# Patient Record
Sex: Female | Born: 1970 | Race: White | Hispanic: No | Marital: Married | Smoking: Never smoker
Health system: Southern US, Community
[De-identification: ages and names within clinical notes are randomized; demographics above are authoritative.]

## PROBLEM LIST (undated history)

## (undated) DIAGNOSIS — S0291XA Unspecified fracture of skull, initial encounter for closed fracture: Secondary | ICD-10-CM

## (undated) HISTORY — PX: HERNIA REPAIR: SHX51

---

## 2013-02-16 DIAGNOSIS — Z720 Tobacco use: Secondary | ICD-10-CM | POA: Insufficient documentation

## 2013-02-16 DIAGNOSIS — E039 Hypothyroidism, unspecified: Secondary | ICD-10-CM | POA: Insufficient documentation

## 2013-02-16 DIAGNOSIS — Z975 Presence of (intrauterine) contraceptive device: Secondary | ICD-10-CM | POA: Insufficient documentation

## 2014-01-13 DIAGNOSIS — Z8719 Personal history of other diseases of the digestive system: Secondary | ICD-10-CM | POA: Insufficient documentation

## 2014-01-13 DIAGNOSIS — Z9889 Other specified postprocedural states: Secondary | ICD-10-CM | POA: Insufficient documentation

## 2015-07-27 DIAGNOSIS — M6790 Unspecified disorder of synovium and tendon, unspecified site: Secondary | ICD-10-CM | POA: Insufficient documentation

## 2015-07-27 DIAGNOSIS — N6001 Solitary cyst of right breast: Secondary | ICD-10-CM | POA: Insufficient documentation

## 2016-03-19 DIAGNOSIS — M47812 Spondylosis without myelopathy or radiculopathy, cervical region: Secondary | ICD-10-CM | POA: Insufficient documentation

## 2016-05-22 DIAGNOSIS — F331 Major depressive disorder, recurrent, moderate: Secondary | ICD-10-CM | POA: Insufficient documentation

## 2016-12-13 DIAGNOSIS — F330 Major depressive disorder, recurrent, mild: Secondary | ICD-10-CM | POA: Diagnosis not present

## 2016-12-13 DIAGNOSIS — F411 Generalized anxiety disorder: Secondary | ICD-10-CM | POA: Diagnosis not present

## 2016-12-17 DIAGNOSIS — F411 Generalized anxiety disorder: Secondary | ICD-10-CM | POA: Diagnosis not present

## 2017-01-21 DIAGNOSIS — M47812 Spondylosis without myelopathy or radiculopathy, cervical region: Secondary | ICD-10-CM | POA: Diagnosis not present

## 2017-01-21 DIAGNOSIS — E039 Hypothyroidism, unspecified: Secondary | ICD-10-CM | POA: Diagnosis not present

## 2017-01-21 DIAGNOSIS — F329 Major depressive disorder, single episode, unspecified: Secondary | ICD-10-CM | POA: Diagnosis not present

## 2017-01-21 DIAGNOSIS — F411 Generalized anxiety disorder: Secondary | ICD-10-CM | POA: Diagnosis not present

## 2017-01-30 DIAGNOSIS — F330 Major depressive disorder, recurrent, mild: Secondary | ICD-10-CM | POA: Diagnosis not present

## 2017-02-18 DIAGNOSIS — F411 Generalized anxiety disorder: Secondary | ICD-10-CM | POA: Diagnosis not present

## 2017-03-11 DIAGNOSIS — F411 Generalized anxiety disorder: Secondary | ICD-10-CM | POA: Diagnosis not present

## 2017-03-13 DIAGNOSIS — F411 Generalized anxiety disorder: Secondary | ICD-10-CM | POA: Diagnosis not present

## 2017-03-13 DIAGNOSIS — F330 Major depressive disorder, recurrent, mild: Secondary | ICD-10-CM | POA: Diagnosis not present

## 2017-04-08 DIAGNOSIS — F411 Generalized anxiety disorder: Secondary | ICD-10-CM | POA: Diagnosis not present

## 2017-04-10 DIAGNOSIS — F411 Generalized anxiety disorder: Secondary | ICD-10-CM | POA: Diagnosis not present

## 2017-04-10 DIAGNOSIS — F331 Major depressive disorder, recurrent, moderate: Secondary | ICD-10-CM | POA: Diagnosis not present

## 2017-05-05 DIAGNOSIS — H40033 Anatomical narrow angle, bilateral: Secondary | ICD-10-CM | POA: Diagnosis not present

## 2017-05-05 DIAGNOSIS — H04123 Dry eye syndrome of bilateral lacrimal glands: Secondary | ICD-10-CM | POA: Diagnosis not present

## 2017-05-06 DIAGNOSIS — F332 Major depressive disorder, recurrent severe without psychotic features: Secondary | ICD-10-CM | POA: Diagnosis not present

## 2017-05-06 DIAGNOSIS — F411 Generalized anxiety disorder: Secondary | ICD-10-CM | POA: Diagnosis not present

## 2017-05-07 DIAGNOSIS — H1013 Acute atopic conjunctivitis, bilateral: Secondary | ICD-10-CM | POA: Diagnosis not present

## 2017-06-10 DIAGNOSIS — F411 Generalized anxiety disorder: Secondary | ICD-10-CM | POA: Diagnosis not present

## 2017-07-08 DIAGNOSIS — F332 Major depressive disorder, recurrent severe without psychotic features: Secondary | ICD-10-CM | POA: Diagnosis not present

## 2017-07-08 DIAGNOSIS — F411 Generalized anxiety disorder: Secondary | ICD-10-CM | POA: Diagnosis not present

## 2017-08-03 DIAGNOSIS — H04123 Dry eye syndrome of bilateral lacrimal glands: Secondary | ICD-10-CM | POA: Diagnosis not present

## 2017-08-05 DIAGNOSIS — F411 Generalized anxiety disorder: Secondary | ICD-10-CM | POA: Diagnosis not present

## 2017-08-09 DIAGNOSIS — M47812 Spondylosis without myelopathy or radiculopathy, cervical region: Secondary | ICD-10-CM | POA: Diagnosis not present

## 2017-08-09 DIAGNOSIS — M25511 Pain in right shoulder: Secondary | ICD-10-CM | POA: Diagnosis not present

## 2017-08-09 DIAGNOSIS — E039 Hypothyroidism, unspecified: Secondary | ICD-10-CM | POA: Diagnosis not present

## 2017-08-09 DIAGNOSIS — Z791 Long term (current) use of non-steroidal anti-inflammatories (NSAID): Secondary | ICD-10-CM | POA: Diagnosis not present

## 2017-09-17 DIAGNOSIS — R41840 Attention and concentration deficit: Secondary | ICD-10-CM | POA: Diagnosis not present

## 2017-09-17 DIAGNOSIS — F902 Attention-deficit hyperactivity disorder, combined type: Secondary | ICD-10-CM | POA: Diagnosis not present

## 2017-09-17 DIAGNOSIS — Z79899 Other long term (current) drug therapy: Secondary | ICD-10-CM | POA: Diagnosis not present

## 2017-09-17 DIAGNOSIS — F419 Anxiety disorder, unspecified: Secondary | ICD-10-CM | POA: Diagnosis not present

## 2017-10-07 DIAGNOSIS — H04123 Dry eye syndrome of bilateral lacrimal glands: Secondary | ICD-10-CM | POA: Diagnosis not present

## 2017-10-22 DIAGNOSIS — R41840 Attention and concentration deficit: Secondary | ICD-10-CM | POA: Diagnosis not present

## 2017-10-22 DIAGNOSIS — Z79899 Other long term (current) drug therapy: Secondary | ICD-10-CM | POA: Diagnosis not present

## 2017-10-22 DIAGNOSIS — F338 Other recurrent depressive disorders: Secondary | ICD-10-CM | POA: Diagnosis not present

## 2017-10-22 DIAGNOSIS — F902 Attention-deficit hyperactivity disorder, combined type: Secondary | ICD-10-CM | POA: Diagnosis not present

## 2017-10-22 DIAGNOSIS — F419 Anxiety disorder, unspecified: Secondary | ICD-10-CM | POA: Diagnosis not present

## 2017-11-07 DIAGNOSIS — F411 Generalized anxiety disorder: Secondary | ICD-10-CM | POA: Diagnosis not present

## 2017-11-12 DIAGNOSIS — F411 Generalized anxiety disorder: Secondary | ICD-10-CM | POA: Diagnosis not present

## 2017-11-12 DIAGNOSIS — F332 Major depressive disorder, recurrent severe without psychotic features: Secondary | ICD-10-CM | POA: Diagnosis not present

## 2017-11-12 DIAGNOSIS — F900 Attention-deficit hyperactivity disorder, predominantly inattentive type: Secondary | ICD-10-CM | POA: Diagnosis not present

## 2017-12-09 DIAGNOSIS — F332 Major depressive disorder, recurrent severe without psychotic features: Secondary | ICD-10-CM | POA: Diagnosis not present

## 2017-12-09 DIAGNOSIS — F411 Generalized anxiety disorder: Secondary | ICD-10-CM | POA: Diagnosis not present

## 2017-12-09 DIAGNOSIS — F900 Attention-deficit hyperactivity disorder, predominantly inattentive type: Secondary | ICD-10-CM | POA: Diagnosis not present

## 2017-12-11 DIAGNOSIS — H04123 Dry eye syndrome of bilateral lacrimal glands: Secondary | ICD-10-CM | POA: Diagnosis not present

## 2017-12-13 DIAGNOSIS — F411 Generalized anxiety disorder: Secondary | ICD-10-CM | POA: Diagnosis not present

## 2017-12-23 DIAGNOSIS — H04123 Dry eye syndrome of bilateral lacrimal glands: Secondary | ICD-10-CM | POA: Diagnosis not present

## 2017-12-30 ENCOUNTER — Encounter (HOSPITAL_COMMUNITY): Payer: Self-pay | Admitting: *Deleted

## 2017-12-30 ENCOUNTER — Emergency Department (HOSPITAL_COMMUNITY)
Admission: EM | Admit: 2017-12-30 | Discharge: 2017-12-30 | Disposition: A | Discharge status: Home / Self Care | Payer: Worker's Compensation | Attending: Emergency Medicine | Admitting: Emergency Medicine

## 2017-12-30 ENCOUNTER — Emergency Department (HOSPITAL_COMMUNITY): Payer: Worker's Compensation

## 2017-12-30 DIAGNOSIS — S39011A Strain of muscle, fascia and tendon of abdomen, initial encounter: Secondary | ICD-10-CM | POA: Insufficient documentation

## 2017-12-30 DIAGNOSIS — R109 Unspecified abdominal pain: Secondary | ICD-10-CM | POA: Diagnosis not present

## 2017-12-30 DIAGNOSIS — X58XXXA Exposure to other specified factors, initial encounter: Secondary | ICD-10-CM | POA: Diagnosis not present

## 2017-12-30 DIAGNOSIS — Y990 Civilian activity done for income or pay: Secondary | ICD-10-CM | POA: Diagnosis not present

## 2017-12-30 DIAGNOSIS — W1830XA Fall on same level, unspecified, initial encounter: Secondary | ICD-10-CM | POA: Diagnosis not present

## 2017-12-30 DIAGNOSIS — Y939 Activity, unspecified: Secondary | ICD-10-CM | POA: Insufficient documentation

## 2017-12-30 DIAGNOSIS — Y929 Unspecified place or not applicable: Secondary | ICD-10-CM | POA: Insufficient documentation

## 2017-12-30 DIAGNOSIS — T148XXA Other injury of unspecified body region, initial encounter: Secondary | ICD-10-CM

## 2017-12-30 LAB — COMPREHENSIVE METABOLIC PANEL
ALT: 16 U/L (ref 0–44)
AST: 22 U/L (ref 15–41)
Albumin: 4 g/dL (ref 3.5–5.0)
Alkaline Phosphatase: 50 U/L (ref 38–126)
Anion gap: 8 (ref 5–15)
BUN: 14 mg/dL (ref 6–20)
CO2: 27 mmol/L (ref 22–32)
CREATININE: 0.88 mg/dL (ref 0.44–1.00)
Calcium: 9.1 mg/dL (ref 8.9–10.3)
Chloride: 102 mmol/L (ref 98–111)
GFR calc Af Amer: >60 mL/min (ref >60)
GFR calc non Af Amer: >60 mL/min (ref >60)
Glucose, Bld: 89 mg/dL (ref 70–99)
Potassium: 4.2 mmol/L (ref 3.5–5.1)
Sodium: 137 mmol/L (ref 135–145)
Total Bilirubin: 0.7 mg/dL (ref 0.3–1.2)
Total Protein: 7.2 g/dL (ref 6.5–8.1)

## 2017-12-30 LAB — CBC WITH DIFFERENTIAL/PLATELET
Abs Immature Granulocytes: 0.01 10*3/uL (ref 0.00–0.07)
Basophils Absolute: 0.1 10*3/uL (ref 0.0–0.1)
Basophils Relative: 1 %
Eosinophils Absolute: 0.2 10*3/uL (ref 0.0–0.5)
Eosinophils Relative: 3 %
HCT: 41.3 % (ref 36.0–46.0)
Hemoglobin: 13.3 g/dL (ref 12.0–15.0)
Immature Granulocytes: 0 %
Lymphocytes Relative: 30 %
Lymphs Abs: 1.8 10*3/uL (ref 0.7–4.0)
MCH: 30.6 pg (ref 26.0–34.0)
MCHC: 32.2 g/dL (ref 30.0–36.0)
MCV: 94.9 fL (ref 80.0–100.0)
MONOS PCT: 11 %
Monocytes Absolute: 0.6 10*3/uL (ref 0.1–1.0)
Neutro Abs: 3.3 10*3/uL (ref 1.7–7.7)
Neutrophils Relative %: 55 %
Platelets: 275 10*3/uL (ref 150–400)
RBC: 4.35 MIL/uL (ref 3.87–5.11)
RDW: 12.2 % (ref 11.5–15.5)
WBC: 5.9 10*3/uL (ref 4.0–10.5)
nRBC: 0 % (ref 0.0–0.2)

## 2017-12-30 LAB — I-STAT CHEM 8, ED
BUN: 13 mg/dL (ref 6–20)
Calcium, Ion: 1.13 mmol/L — ABNORMAL LOW (ref 1.15–1.40)
Chloride: 101 mmol/L (ref 98–111)
Creatinine, Ser: 0.9 mg/dL (ref 0.44–1.00)
Glucose, Bld: 87 mg/dL (ref 70–99)
HCT: 42 % (ref 36.0–46.0)
Hemoglobin: 14.3 g/dL (ref 12.0–15.0)
Potassium: 4.3 mmol/L (ref 3.5–5.1)
Sodium: 138 mmol/L (ref 135–145)
TCO2: 30 mmol/L (ref 22–32)

## 2017-12-30 LAB — LIPASE, BLOOD: Lipase: 31 U/L (ref 11–51)

## 2017-12-30 LAB — I-STAT BETA HCG BLOOD, ED (MC, WL, AP ONLY): I-stat hCG, quantitative: <5 m[IU]/mL (ref <5)

## 2017-12-30 MED ORDER — SODIUM CHLORIDE (PF) 0.9 % IJ SOLN
INTRAMUSCULAR | Status: AC
  Filled 2017-12-30: qty 50

## 2017-12-30 MED ORDER — IOPAMIDOL (ISOVUE-300) INJECTION 61%
100.0000 mL | Freq: Once | INTRAVENOUS | Status: AC | PRN
  Administered 2017-12-30: 100 mL via INTRAVENOUS

## 2017-12-30 MED ORDER — IOPAMIDOL (ISOVUE-300) INJECTION 61%
INTRAVENOUS | Status: AC
  Filled 2017-12-30: qty 100

## 2017-12-30 NOTE — ED Provider Notes (Signed)
[facility]-EMERGENCY DEPT Provider Note   CSN: [id number] Arrival date & time: 12/30/17  0904     History   Chief Complaint Chief Complaint  Patient presents with  . Fall  . Abdominal Pain    HPI Sandra Lyons is a 47 y.o. female presenting for evaluation of abdominal pain.  Patient states 4 days ago she was blowing leaves when she started to fall, and twisted to prevent falling.  This happened multiple times.  Since then, she has had periumbilical abdominal pain.  Pain is worse with movement.  This is where she had her hernia repair with mesh placement, and she is concerned that there are complications from this.  Surgery was 4 years ago, she has not had no complications since.  She has not taken anything for pain including Tylenol or ibuprofen.  She denies pain elsewhere.  She has nausea, vomiting, abnormal bowel movements.  Denies fevers, chills, chest pain, shortness of breath, or urinary symptoms.  No change in pain with p.o., urination, or bowel movements.  Patient states she has a history of ADHD, anxiety/depression, and hypothyroidism.  No recent change in her medications.  HPI  History reviewed. No pertinent past medical history.  There are no active problems to display for this patient.   Past Surgical History:  Procedure Laterality Date  . HERNIA REPAIR       OB History   No obstetric history on file.      Home Medications    Prior to Admission medications   Medication Sig Start Date End Date Taking? Authorizing Provider  amphetamine-dextroamphetamine (ADDERALL) 30 MG tablet Take 30 mg by mouth 2 (two) times daily.   Yes [provider]  buPROPion (WELLBUTRIN SR) 150 MG 12 hr tablet Take 450 mg by mouth 2 (two) times daily.   Yes [provider]  DULoxetine (CYMBALTA) 60 MG capsule Take 120 mg by mouth daily.   Yes [provider]  levothyroxine (SYNTHROID, LEVOTHROID) 125 MCG tablet Take 125 mcg by mouth  daily. 10/02/16  Yes [provider]    Family History No family history on file.  Social History Social History   Tobacco Use  . Smoking status: Never Smoker  . Smokeless tobacco: Never Used  Substance Use Topics  . Alcohol use: Yes  . Drug use: Not on file     Allergies   Sulfa antibiotics   Review of Systems Review of Systems  Gastrointestinal: Positive for abdominal pain.  All other systems reviewed and are negative.    Physical Exam Updated Vital Signs BP (!) 144/106 (BP Location: Left Arm)   Pulse 93   Temp 98.7 F (37.1 C) (Oral)   Resp 16   LMP  (LMP Unknown)   SpO2 98%   Physical Exam Vitals signs and nursing note reviewed.  Constitutional:      General: She is not in acute distress.    Appearance: She is well-developed.     Comments: Appears nontoxic  HENT:     Head: Normocephalic and atraumatic.  Eyes:     Conjunctiva/sclera: Conjunctivae normal.     Pupils: Pupils are equal, round, and reactive to light.  Neck:     Musculoskeletal: Normal range of motion and neck supple.  Cardiovascular:     Rate and Rhythm: Normal rate and regular rhythm.     Pulses: Normal pulses.  Pulmonary:     Effort: Pulmonary effort is normal. No respiratory distress.     Breath sounds:  Normal breath sounds. No wheezing.  Abdominal:     General: Bowel sounds are normal. There is no distension.     Palpations: Abdomen is soft.     Tenderness: There is abdominal tenderness in the periumbilical area.     Hernia: No hernia is present.     Comments: Tenderness to palpation of periumbilical abdomen with voluntary guarding.  No rigidity or distention.  Negative rebound.  No obvious masses.  No obvious hernia.  Musculoskeletal: Normal range of motion.  Skin:    General: Skin is warm and dry.  Neurological:     Mental Status: She is alert and oriented to person, place, and time.      ED Treatments / Results  Labs (all labs ordered are listed, but only  abnormal results are displayed) Labs Reviewed  I-STAT CHEM 8, ED - Abnormal; Notable for the following components:      Result Value   Calcium, Ion 1.13 (*)    All other components within normal limits  CBC WITH DIFFERENTIAL/PLATELET  COMPREHENSIVE METABOLIC PANEL  LIPASE, BLOOD  I-STAT BETA HCG BLOOD, ED ([facility], [facility], [facility] ONLY)    EKG None  Radiology Ct Abdomen Pelvis W Contrast  Result Date: 12/30/2017 CLINICAL DATA:  Periumbilical region abdominal pain.  Recent fall EXAM: CT ABDOMEN AND PELVIS WITH CONTRAST TECHNIQUE: Multidetector CT imaging of the abdomen and pelvis was performed using the standard protocol following bolus administration of intravenous contrast. CONTRAST:  100mL ISOVUE-300 IOPAMIDOL (ISOVUE-300) INJECTION 61% COMPARISON:  None. FINDINGS: Lower chest: Lung bases are clear.  No basilar pneumothorax evident. Hepatobiliary: There is a degree of hepatic steatosis. No focal liver lesions are appreciable. No evident perihepatic fluid. Gallbladder wall is not appreciably thickened. There is no biliary duct dilatation. Pancreas: No pancreatic mass or inflammatory focus. Spleen: No splenic lesions are evident.  No perisplenic fluid. Adrenals/Urinary Tract: Adrenals bilaterally appear unremarkable. Kidneys bilaterally show no evident mass or hydronephrosis on either side. No perinephric stranding or fluid on either side. There is no appreciable renal or ureteral calculus on either side. Urinary bladder is midline with wall thickness within normal limits. Stomach/Bowel: There is moderate stool throughout the colon. There is no appreciable bowel wall or mesenteric thickening. There is no evident bowel obstruction. There is no evident free air or portal venous air. Vascular/Lymphatic: There is no abdominal aortic aneurysm. No evident vascular lesions. There is no appreciable adenopathy in the abdomen or pelvis. Reproductive: The uterus is anteverted. There is an intrauterine device positioned  within the endometrium. There is no evident pelvic mass. Other: Appendix appears normal. There is no evident abscess or ascites in the abdomen or pelvis. Musculoskeletal: There is degenerative change at L5-S1 with vacuum phenomenon. No evident blastic or lytic bone lesions. There is no intramuscular or abdominal wall lesion evident. IMPRESSION: 1. A cause for patient's symptoms has not been established with this study. 2. No traumatic appearing lesion evident. Major viscera appear intact. No abnormal fluid collections. 3. No appreciable bowel wall thickening or bowel obstruction. No abscess in the abdomen or pelvis. Appendix appears normal. 4.  No evident renal or ureteral calculus.  No hydronephrosis. 5.  Intrauterine device positioned within the endometrium. Electronically Signed   By: Bretta BangWilliam  Woodruff III M.D.   On: 12/30/2017 15:03    Procedures Procedures (including critical care time)  Medications Ordered in ED Medications  iopamidol (ISOVUE-300) 61 % injection (has no administration in time range)  sodium chloride (PF) 0.9 % injection (has no administration  in time range)  iopamidol (ISOVUE-300) 61 % injection 100 mL (100 mLs Intravenous Contrast Given 12/30/17 1450)     Initial Impression / Assessment and Plan / ED Course  I have reviewed the triage vital signs and the nursing notes.  Pertinent labs & imaging results that were available during my care of the patient were reviewed by me and considered in my medical decision making (see chart for details).     Pt presenting for evaluation of abdominal pain.  Physical examination, she is afebrile not tachycardic.  Appears nontoxic.  Pain is localized in the periumbilical abdomen.  No obvious hernia or signs of incarceration.  However, considering history of surgery and mesh, will obtain CT for further evaluation.  Basic labs ordered.  Patient does not want any thing for pain at this time.  Labs reassuring.  CT pending.  CT without  acute findings.  No sign of tear, injury, or hernia.  Discussed findings with patient.  Discussed episodes are likely muscular in nature.  Discussed Intermatic treatment with Tylenol, ibuprofen, and heat/ice.  Patient states she has muscle relaxaers at home, will not give rx.  Follow-up with PCP as needed.  At this time, patient appears safe discharge.  Return precautions given.  Patient states she understands and agrees to plan.   Final Clinical Impressions(s) / ED Diagnoses   Final diagnoses:  Acute abdominal pain  Muscle strain    ED Discharge Orders    None       Alveria ApleyCaccavale, Timisha Mondry, PA-C 12/30/17 1853    Doug SouJacubowitz, Sam, MD 12/31/17 [phone]

## 2017-12-30 NOTE — Discharge Instructions (Addendum)
Take tylenol or ibuprofen as needed or pain.  You may try your muscle relaxer for pain control.  Use ice packs for heating pads to help with pain control.  Follow up with your primary care doctor as needed.  Return to the ER with any new, worsening, or concerning symptoms.

## 2017-12-30 NOTE — ED Triage Notes (Signed)
Pt fell 4 days ago while blowing leaves with a backpack blower. Pt has hx of hernia repair and states she has pain in the same area, pt is concerned she may have damaged mesh.

## 2018-01-06 DIAGNOSIS — F900 Attention-deficit hyperactivity disorder, predominantly inattentive type: Secondary | ICD-10-CM | POA: Diagnosis not present

## 2018-01-06 DIAGNOSIS — F411 Generalized anxiety disorder: Secondary | ICD-10-CM | POA: Diagnosis not present

## 2018-01-06 DIAGNOSIS — F332 Major depressive disorder, recurrent severe without psychotic features: Secondary | ICD-10-CM | POA: Diagnosis not present

## 2018-01-13 DIAGNOSIS — F411 Generalized anxiety disorder: Secondary | ICD-10-CM | POA: Diagnosis not present

## 2018-01-16 DIAGNOSIS — F411 Generalized anxiety disorder: Secondary | ICD-10-CM | POA: Diagnosis not present

## 2018-01-27 DIAGNOSIS — F411 Generalized anxiety disorder: Secondary | ICD-10-CM | POA: Diagnosis not present

## 2018-02-03 DIAGNOSIS — F332 Major depressive disorder, recurrent severe without psychotic features: Secondary | ICD-10-CM | POA: Diagnosis not present

## 2018-02-03 DIAGNOSIS — F411 Generalized anxiety disorder: Secondary | ICD-10-CM | POA: Diagnosis not present

## 2018-02-03 DIAGNOSIS — F900 Attention-deficit hyperactivity disorder, predominantly inattentive type: Secondary | ICD-10-CM | POA: Diagnosis not present

## 2018-02-22 DIAGNOSIS — H1013 Acute atopic conjunctivitis, bilateral: Secondary | ICD-10-CM | POA: Diagnosis not present

## 2018-02-24 DIAGNOSIS — F411 Generalized anxiety disorder: Secondary | ICD-10-CM | POA: Diagnosis not present

## 2018-05-05 DIAGNOSIS — F900 Attention-deficit hyperactivity disorder, predominantly inattentive type: Secondary | ICD-10-CM | POA: Diagnosis not present

## 2018-05-05 DIAGNOSIS — F331 Major depressive disorder, recurrent, moderate: Secondary | ICD-10-CM | POA: Diagnosis not present

## 2018-05-05 DIAGNOSIS — F411 Generalized anxiety disorder: Secondary | ICD-10-CM | POA: Diagnosis not present

## 2018-06-02 DIAGNOSIS — F411 Generalized anxiety disorder: Secondary | ICD-10-CM | POA: Diagnosis not present

## 2018-06-16 DIAGNOSIS — F411 Generalized anxiety disorder: Secondary | ICD-10-CM | POA: Diagnosis not present

## 2018-06-17 DIAGNOSIS — H04123 Dry eye syndrome of bilateral lacrimal glands: Secondary | ICD-10-CM | POA: Diagnosis not present

## 2018-06-17 DIAGNOSIS — H40033 Anatomical narrow angle, bilateral: Secondary | ICD-10-CM | POA: Diagnosis not present

## 2018-06-23 DIAGNOSIS — F332 Major depressive disorder, recurrent severe without psychotic features: Secondary | ICD-10-CM | POA: Diagnosis not present

## 2018-06-23 DIAGNOSIS — F411 Generalized anxiety disorder: Secondary | ICD-10-CM | POA: Diagnosis not present

## 2018-06-30 DIAGNOSIS — F411 Generalized anxiety disorder: Secondary | ICD-10-CM | POA: Diagnosis not present

## 2018-07-07 DIAGNOSIS — F411 Generalized anxiety disorder: Secondary | ICD-10-CM | POA: Diagnosis not present

## 2018-07-09 DIAGNOSIS — E039 Hypothyroidism, unspecified: Secondary | ICD-10-CM | POA: Diagnosis not present

## 2018-07-09 DIAGNOSIS — F329 Major depressive disorder, single episode, unspecified: Secondary | ICD-10-CM | POA: Diagnosis not present

## 2018-07-13 DIAGNOSIS — Z1159 Encounter for screening for other viral diseases: Secondary | ICD-10-CM | POA: Diagnosis not present

## 2018-07-14 DIAGNOSIS — F411 Generalized anxiety disorder: Secondary | ICD-10-CM | POA: Diagnosis not present

## 2018-07-14 DIAGNOSIS — F332 Major depressive disorder, recurrent severe without psychotic features: Secondary | ICD-10-CM | POA: Diagnosis not present

## 2018-07-21 DIAGNOSIS — F411 Generalized anxiety disorder: Secondary | ICD-10-CM | POA: Diagnosis not present

## 2018-07-28 DIAGNOSIS — F411 Generalized anxiety disorder: Secondary | ICD-10-CM | POA: Diagnosis not present

## 2018-08-04 DIAGNOSIS — F411 Generalized anxiety disorder: Secondary | ICD-10-CM | POA: Diagnosis not present

## 2018-08-11 DIAGNOSIS — F411 Generalized anxiety disorder: Secondary | ICD-10-CM | POA: Diagnosis not present

## 2018-08-18 DIAGNOSIS — E039 Hypothyroidism, unspecified: Secondary | ICD-10-CM | POA: Diagnosis not present

## 2018-08-18 DIAGNOSIS — F411 Generalized anxiety disorder: Secondary | ICD-10-CM | POA: Diagnosis not present

## 2018-08-25 DIAGNOSIS — F411 Generalized anxiety disorder: Secondary | ICD-10-CM | POA: Diagnosis not present

## 2018-08-28 DIAGNOSIS — H04123 Dry eye syndrome of bilateral lacrimal glands: Secondary | ICD-10-CM | POA: Diagnosis not present

## 2018-09-01 DIAGNOSIS — F411 Generalized anxiety disorder: Secondary | ICD-10-CM | POA: Diagnosis not present

## 2018-09-15 DIAGNOSIS — F411 Generalized anxiety disorder: Secondary | ICD-10-CM | POA: Diagnosis not present

## 2018-09-29 DIAGNOSIS — F411 Generalized anxiety disorder: Secondary | ICD-10-CM | POA: Diagnosis not present

## 2018-10-06 DIAGNOSIS — F411 Generalized anxiety disorder: Secondary | ICD-10-CM | POA: Diagnosis not present

## 2018-10-10 DIAGNOSIS — Z0000 Encounter for general adult medical examination without abnormal findings: Secondary | ICD-10-CM | POA: Diagnosis not present

## 2018-10-13 DIAGNOSIS — F411 Generalized anxiety disorder: Secondary | ICD-10-CM | POA: Diagnosis not present

## 2018-10-15 DIAGNOSIS — Z23 Encounter for immunization: Secondary | ICD-10-CM | POA: Diagnosis not present

## 2018-10-15 DIAGNOSIS — Z13220 Encounter for screening for lipoid disorders: Secondary | ICD-10-CM | POA: Diagnosis not present

## 2018-10-15 DIAGNOSIS — Z0000 Encounter for general adult medical examination without abnormal findings: Secondary | ICD-10-CM | POA: Diagnosis not present

## 2018-10-18 DIAGNOSIS — H04123 Dry eye syndrome of bilateral lacrimal glands: Secondary | ICD-10-CM | POA: Diagnosis not present

## 2018-10-27 DIAGNOSIS — F411 Generalized anxiety disorder: Secondary | ICD-10-CM | POA: Diagnosis not present

## 2018-10-28 ENCOUNTER — Encounter: Payer: Self-pay | Admitting: Adult Health

## 2018-10-28 ENCOUNTER — Other Ambulatory Visit: Payer: Self-pay

## 2018-10-28 ENCOUNTER — Ambulatory Visit (INDEPENDENT_AMBULATORY_CARE_PROVIDER_SITE_OTHER): Payer: BC Managed Care – PPO | Admitting: Adult Health

## 2018-10-28 DIAGNOSIS — F331 Major depressive disorder, recurrent, moderate: Secondary | ICD-10-CM | POA: Diagnosis not present

## 2018-10-28 DIAGNOSIS — F411 Generalized anxiety disorder: Secondary | ICD-10-CM | POA: Diagnosis not present

## 2018-10-28 DIAGNOSIS — F909 Attention-deficit hyperactivity disorder, unspecified type: Secondary | ICD-10-CM | POA: Diagnosis not present

## 2018-10-28 DIAGNOSIS — G4700 Insomnia, unspecified: Secondary | ICD-10-CM

## 2018-10-28 DIAGNOSIS — F3181 Bipolar II disorder: Secondary | ICD-10-CM

## 2018-10-28 DIAGNOSIS — N926 Irregular menstruation, unspecified: Secondary | ICD-10-CM | POA: Diagnosis not present

## 2018-10-28 MED ORDER — AMPHETAMINE-DEXTROAMPHET ER 30 MG PO CP24: 30.0000 mg | ORAL_CAPSULE | Freq: Every day | ORAL | 0 refills | Status: DC

## 2018-10-28 MED ORDER — DULOXETINE HCL 60 MG PO CPEP: 120.0000 mg | ORAL_CAPSULE | Freq: Every day | ORAL | 5 refills | Status: DC

## 2018-10-28 MED ORDER — AMPHETAMINE-DEXTROAMPHETAMINE 30 MG PO TABS: 30.0000 mg | ORAL_TABLET | Freq: Every day | ORAL | 0 refills | Status: DC

## 2018-10-28 MED ORDER — ARIPIPRAZOLE 5 MG PO TABS: 5.0000 mg | ORAL_TABLET | Freq: Every day | ORAL | 5 refills | Status: DC

## 2018-10-28 MED ORDER — BUPROPION HCL ER (XL) 150 MG PO TB24: ORAL_TABLET | ORAL | 5 refills | Status: DC

## 2018-10-28 NOTE — Progress Notes (Signed)
Sandra Lyons [id number] 12-Jul-1970 48 y.o.  Subjective:   Patient ID:  Sandra Lyons is a 48 y.o. (DOB 12-Jul-1970) female.  Chief Complaint: No chief complaint on file.   HPI Sandra Lyons presents to the office today for follow-up of anxiety, depression, insomnia, Bipolar 2 disorder and ADHD.   Describes mood today as "ok". Pleasant. Mood symptoms - denies depression, anxiety, and irritability. Stating "I've been doing pretty good". Stable interest and motivation. Taking medications as prescribed.  Energy levels stable. Active, does not have a regular exercise routine. 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  Enjoys some usual interests and activities. Spending time with family - husband. Son turning [phone] in November. Appetite adequate. Weight fluctuates.  Sleeps well most nights. Averages 6 to 8 hours. Focus and concentration stable. Completing tasks. Managing aspects of household. Work going well. Denies SI or HI. Denies AH or VH.  Review of Systems:  Review of Systems  Musculoskeletal: Negative for gait problem.  Neurological: Negative for tremors.  Psychiatric/Behavioral:       Please refer to HPI    Medications: I have reviewed the patient's current medications.  Current Outpatient Medications  Medication Sig Dispense Refill  . amphetamine-dextroamphetamine (ADDERALL XR) 30 MG 24 hr capsule Take 1 capsule (30 mg total) by mouth daily. 30 capsule 0  . [START ON 11/25/2018] amphetamine-dextroamphetamine (ADDERALL XR) 30 MG 24 hr capsule Take 1 capsule (30 mg total) by mouth daily. 30 capsule 0  . [START ON 12/23/2018] amphetamine-dextroamphetamine (ADDERALL XR) 30 MG 24 hr capsule Take 1 capsule (30 mg total) by mouth daily. 30 capsule 0  . amphetamine-dextroamphetamine (ADDERALL) 30 MG tablet Take 1 tablet by mouth daily. 30 tablet 0  . [START ON 11/25/2018] amphetamine-dextroamphetamine (ADDERALL) 30 MG tablet Take 1 tablet by mouth daily. 30 tablet 0  . [START ON 12/23/2018]  amphetamine-dextroamphetamine (ADDERALL) 30 MG tablet Take 1 tablet by mouth daily. 30 tablet 0  . ARIPiprazole (ABILIFY) 5 MG tablet Take 1 tablet (5 mg total) by mouth daily. 30 tablet 5  . buPROPion (WELLBUTRIN XL) 150 MG 24 hr tablet Take three tablets every morning. 90 tablet 5  . DULoxetine (CYMBALTA) 60 MG capsule Take 2 capsules (120 mg total) by mouth daily. 60 capsule 5  . levothyroxine (SYNTHROID, LEVOTHROID) 125 MCG tablet Take 125 mcg by mouth daily.     No current facility-administered medications for this visit.     Medication Side Effects: None  Allergies:  Allergies  Allergen Reactions  . Sulfa Antibiotics     Childhood allergy.     History reviewed. No pertinent past medical history.  History reviewed. No pertinent family history.  Social History   Socioeconomic History  . Marital status: Married    Spouse name: Not on file  . Number of children: Not on file  . Years of education: Not on file  . Highest education level: Not on file  Occupational History  . Not on file  Social Needs  . Financial resource strain: Not on file  . Food insecurity    Worry: Not on file    Inability: Not on file  . Transportation needs    Medical: Not on file    Non-medical: Not on file  Tobacco Use  . Smoking status: Never Smoker  . Smokeless tobacco: Never Used  Substance and Sexual Activity  . Alcohol use: Yes  . Drug use: Not on file  . Sexual activity: Not on file  Lifestyle  . Physical activity    Days  per week: Not on file    Minutes per session: Not on file  . Stress: Not on file  Relationships  . Social Musician on phone: Not on file    Gets together: Not on file    Attends religious service: Not on file    Active member of club or organization: Not on file    Attends meetings of clubs or organizations: Not on file    Relationship status: Not on file  . Intimate partner violence    Fear of current or ex partner: Not on file    Emotionally  abused: Not on file    Physically abused: Not on file    Forced sexual activity: Not on file  Other Topics Concern  . Not on file  Social History Narrative  . Not on file    Past Medical History, Surgical history, Social history, and Family history were reviewed and updated as appropriate.   Please see review of systems for further details on the patient's review from today.   Objective:   Physical Exam:  There were no vitals taken for this visit.  Physical Exam Constitutional:      General: She is not in acute distress.    Appearance: She is well-developed.  Musculoskeletal:        General: No deformity.  Neurological:     Mental Status: She is alert and oriented to person, place, and time.     Coordination: Coordination normal.  Psychiatric:        Attention and Perception: Attention and perception normal. She does not perceive auditory or visual hallucinations.        Mood and Affect: Mood normal. Mood is not anxious or depressed. Affect is not labile, blunt, angry or inappropriate.        Speech: Speech normal.        Behavior: Behavior normal.        Thought Content: Thought content normal. Thought content is not paranoid or delusional. Thought content does not include homicidal or suicidal ideation. Thought content does not include homicidal or suicidal plan.        Cognition and Memory: Cognition and memory normal.        Judgment: Judgment normal.     Comments: Insight intact     Lab Review:     Component Value Date/Time   NA 138 12/30/2017 1343   K 4.3 12/30/2017 1343   CL 101 12/30/2017 1343   CO2 27 12/30/2017 1336   GLUCOSE 87 12/30/2017 1343   BUN 13 12/30/2017 1343   CREATININE 0.90 12/30/2017 1343   CALCIUM 9.1 12/30/2017 1336   PROT 7.2 12/30/2017 1336   ALBUMIN 4.0 12/30/2017 1336   AST 22 12/30/2017 1336   ALT 16 12/30/2017 1336   ALKPHOS 50 12/30/2017 1336   BILITOT 0.7 12/30/2017 1336   GFRNONAA >60 12/30/2017 1336   GFRAA >60 12/30/2017  1336       Component Value Date/Time   WBC 5.9 12/30/2017 1336   RBC 4.35 12/30/2017 1336   HGB 14.3 12/30/2017 1343   HCT 42.0 12/30/2017 1343   PLT 275 12/30/2017 1336   MCV 94.9 12/30/2017 1336   MCH 30.6 12/30/2017 1336   MCHC 32.2 12/30/2017 1336   RDW 12.2 12/30/2017 1336   LYMPHSABS 1.8 12/30/2017 1336   MONOABS 0.6 12/30/2017 1336   EOSABS 0.2 12/30/2017 1336   BASOSABS 0.1 12/30/2017 1336    No results found for: POCLITH, LITHIUM  No results found for: PHENYTOIN, PHENOBARB, VALPROATE, CBMZ   .res Assessment: Plan:    Plan:  1. Adderall XR 30mg  daily 2. Adderall 30mg  daily 3. Wellbutrin XL 150mg  - 3 daily 4. Cymbalta 60mg  - 2 daily  5. Abilify 5mg  daily   RTC 4 weeks  Patient advised to contact office with any questions, adverse effects, or acute worsening in signs and symptoms.  Discussed potential metabolic side effects associated with atypical antipsychotics, as well as potential risk for movement side effects. Advised pt to contact office if movement side effects occur.   Discussed potential benefits, risks, and side effects of stimulants with patient to include increased heart rate, palpitations, insomnia, increased anxiety, increased irritability, or decreased appetite.  Instructed patient to contact office if experiencing any significant tolerability issues.  Diagnoses and all orders for this visit:  Insomnia, unspecified type  Attention deficit hyperactivity disorder (ADHD), unspecified ADHD type -     amphetamine-dextroamphetamine (ADDERALL XR) 30 MG 24 hr capsule; Take 1 capsule (30 mg total) by mouth daily. -     amphetamine-dextroamphetamine (ADDERALL) 30 MG tablet; Take 1 tablet by mouth daily. -     amphetamine-dextroamphetamine (ADDERALL) 30 MG tablet; Take 1 tablet by mouth daily. -     amphetamine-dextroamphetamine (ADDERALL) 30 MG tablet; Take 1 tablet by mouth daily. -     amphetamine-dextroamphetamine (ADDERALL XR) 30 MG 24 hr capsule;  Take 1 capsule (30 mg total) by mouth daily. -     amphetamine-dextroamphetamine (ADDERALL XR) 30 MG 24 hr capsule; Take 1 capsule (30 mg total) by mouth daily.  Generalized anxiety disorder -     buPROPion (WELLBUTRIN XL) 150 MG 24 hr tablet; Take three tablets every morning. -     DULoxetine (CYMBALTA) 60 MG capsule; Take 2 capsules (120 mg total) by mouth daily.  Major depressive disorder, recurrent episode, moderate (HCC) -     buPROPion (WELLBUTRIN XL) 150 MG 24 hr tablet; Take three tablets every morning. -     DULoxetine (CYMBALTA) 60 MG capsule; Take 2 capsules (120 mg total) by mouth daily.  Bipolar II disorder (HCC) -     ARIPiprazole (ABILIFY) 5 MG tablet; Take 1 tablet (5 mg total) by mouth daily.     Please see After Visit Summary for patient specific instructions.  No future appointments.  No orders of the defined types were placed in this encounter.   -------------------------------

## 2018-11-03 DIAGNOSIS — F411 Generalized anxiety disorder: Secondary | ICD-10-CM | POA: Diagnosis not present

## 2018-11-10 DIAGNOSIS — F411 Generalized anxiety disorder: Secondary | ICD-10-CM | POA: Diagnosis not present

## 2018-11-17 DIAGNOSIS — F411 Generalized anxiety disorder: Secondary | ICD-10-CM | POA: Diagnosis not present

## 2018-11-24 DIAGNOSIS — F411 Generalized anxiety disorder: Secondary | ICD-10-CM | POA: Diagnosis not present

## 2018-11-25 DIAGNOSIS — H1013 Acute atopic conjunctivitis, bilateral: Secondary | ICD-10-CM | POA: Diagnosis not present

## 2018-12-01 DIAGNOSIS — F411 Generalized anxiety disorder: Secondary | ICD-10-CM | POA: Diagnosis not present

## 2018-12-22 DIAGNOSIS — F411 Generalized anxiety disorder: Secondary | ICD-10-CM | POA: Diagnosis not present

## 2019-01-26 ENCOUNTER — Ambulatory Visit: Payer: BC Managed Care – PPO | Admitting: Adult Health

## 2019-01-26 DIAGNOSIS — F411 Generalized anxiety disorder: Secondary | ICD-10-CM | POA: Diagnosis not present

## 2019-01-28 ENCOUNTER — Encounter: Payer: Self-pay | Admitting: Adult Health

## 2019-01-28 ENCOUNTER — Other Ambulatory Visit: Payer: Self-pay

## 2019-01-28 ENCOUNTER — Ambulatory Visit (INDEPENDENT_AMBULATORY_CARE_PROVIDER_SITE_OTHER): Payer: BC Managed Care – PPO | Admitting: Adult Health

## 2019-01-28 DIAGNOSIS — F909 Attention-deficit hyperactivity disorder, unspecified type: Secondary | ICD-10-CM

## 2019-01-28 DIAGNOSIS — F3181 Bipolar II disorder: Secondary | ICD-10-CM | POA: Diagnosis not present

## 2019-01-28 DIAGNOSIS — F331 Major depressive disorder, recurrent, moderate: Secondary | ICD-10-CM

## 2019-01-28 DIAGNOSIS — F411 Generalized anxiety disorder: Secondary | ICD-10-CM | POA: Diagnosis not present

## 2019-01-28 DIAGNOSIS — G4700 Insomnia, unspecified: Secondary | ICD-10-CM

## 2019-01-28 MED ORDER — AMPHETAMINE-DEXTROAMPHET ER 30 MG PO CP24: 30.0000 mg | ORAL_CAPSULE | Freq: Every day | ORAL | 0 refills | Status: DC

## 2019-01-28 MED ORDER — AMPHETAMINE-DEXTROAMPHETAMINE 30 MG PO TABS: 30.0000 mg | ORAL_TABLET | Freq: Every day | ORAL | 0 refills | Status: DC

## 2019-01-28 MED ORDER — DULOXETINE HCL 60 MG PO CPEP: 120.0000 mg | ORAL_CAPSULE | Freq: Every day | ORAL | 5 refills | Status: DC

## 2019-01-28 MED ORDER — ARIPIPRAZOLE 5 MG PO TABS: 5.0000 mg | ORAL_TABLET | Freq: Every day | ORAL | 5 refills | Status: DC

## 2019-01-28 MED ORDER — BUPROPION HCL ER (XL) 150 MG PO TB24: ORAL_TABLET | ORAL | 5 refills | Status: DC

## 2019-01-28 NOTE — Progress Notes (Signed)
Nelda Luckey [id number] [birth year] 49 y.o.  Subjective:   Patient ID:  Tangela Dolliver is a 49 y.o. (DOB [birth year]) female.  Chief Complaint: No chief complaint on file.   HPI   Georgina Krist presents to the office today for follow-up of anxiety, depression, insomnia, Bipolar 2 disorder and ADHD.   Describes mood today as "ok". Pleasant. Mood symptoms - reports depression, anxiety, and irritability - stating "I feel like a caged animal". Wanting to get out and do things again. She and husband doing. Does not want to make any medication changes. Stable interest and motivation. Taking medications as prescribed.  Energy levels stable. Active, does not have a regular exercise routine. Works full-time in Aeronautical engineer.  Enjoys some usual interests and activities. Married. Lives with husband. Son 40 - got his driver's license in November. Appetite adequate. Weight stable.  Sleeps well most nights. Averages 6 to 8 hours. Focus and concentration stable. Completing tasks. Managing aspects of household. Work going well. Denies SI or HI. Denies AH or VH.  Review of Systems:  Review of Systems  Musculoskeletal: Negative for gait problem.  Neurological: Negative for tremors.  Psychiatric/Behavioral:       Please refer to HPI   Medications: I have reviewed the patient's current medications.  Current Outpatient Medications  Medication Sig Dispense Refill  . amphetamine-dextroamphetamine (ADDERALL XR) 30 MG 24 hr capsule Take 1 capsule (30 mg total) by mouth daily. 30 capsule 0  . amphetamine-dextroamphetamine (ADDERALL XR) 30 MG 24 hr capsule Take 1 capsule (30 mg total) by mouth daily. 30 capsule 0  . amphetamine-dextroamphetamine (ADDERALL XR) 30 MG 24 hr capsule Take 1 capsule (30 mg total) by mouth daily. 30 capsule 0  . amphetamine-dextroamphetamine (ADDERALL) 30 MG tablet Take 1 tablet by mouth daily. 30 tablet 0  . amphetamine-dextroamphetamine (ADDERALL) 30 MG tablet Take 1 tablet by  mouth daily. 30 tablet 0  . amphetamine-dextroamphetamine (ADDERALL) 30 MG tablet Take 1 tablet by mouth daily. 30 tablet 0  . ARIPiprazole (ABILIFY) 5 MG tablet Take 1 tablet (5 mg total) by mouth daily. 30 tablet 5  . buPROPion (WELLBUTRIN XL) 150 MG 24 hr tablet Take three tablets every morning. 90 tablet 5  . DULoxetine (CYMBALTA) 60 MG capsule Take 2 capsules (120 mg total) by mouth daily. 60 capsule 5  . levothyroxine (SYNTHROID, LEVOTHROID) 125 MCG tablet Take 125 mcg by mouth daily.     No current facility-administered medications for this visit.    Medication Side Effects: None  Allergies:  Allergies  Allergen Reactions  . Sulfa Antibiotics     Childhood allergy.     No past medical history on file.  No family history on file.  Social History   Socioeconomic History  . Marital status: Married    Spouse name: Not on file  . Number of children: Not on file  . Years of education: Not on file  . Highest education level: Not on file  Occupational History  . Not on file  Tobacco Use  . Smoking status: Never Smoker  . Smokeless tobacco: Never Used  Substance and Sexual Activity  . Alcohol use: Yes  . Drug use: Not on file  . Sexual activity: Not on file  Other Topics Concern  . Not on file  Social History Narrative  . Not on file   Social Determinants of Health   Financial Resource Strain:   . Difficulty of Paying Living Expenses: Not on file  Food Insecurity:   . Worried  About Running Out of Food in the Last Year: Not on file  . Ran Out of Food in the Last Year: Not on file  Transportation Needs:   . Lack of Transportation (Medical): Not on file  . Lack of Transportation (Non-Medical): Not on file  Physical Activity:   . Days of Exercise per Week: Not on file  . Minutes of Exercise per Session: Not on file  Stress:   . Feeling of Stress : Not on file  Social Connections:   . Frequency of Communication with Friends and Family: Not on file  . Frequency  of Social Gatherings with Friends and Family: Not on file  . Attends Religious Services: Not on file  . Active Member of Clubs or Organizations: Not on file  . Attends Archivist Meetings: Not on file  . Marital Status: Not on file  Intimate Partner Violence:   . Fear of Current or Ex-Partner: Not on file  . Emotionally Abused: Not on file  . Physically Abused: Not on file  . Sexually Abused: Not on file    Past Medical History, Surgical history, Social history, and Family history were reviewed and updated as appropriate.   Please see review of systems for further details on the patient's review from today.   Objective:   Physical Exam:  There were no vitals taken for this visit.  Physical Exam Constitutional:      General: She is not in acute distress.    Appearance: She is well-developed.  Musculoskeletal:        General: No deformity.  Neurological:     Mental Status: She is alert and oriented to person, place, and time.     Coordination: Coordination normal.  Psychiatric:        Attention and Perception: Attention and perception normal. She does not perceive auditory or visual hallucinations.        Mood and Affect: Mood is depressed. Mood is not anxious. Affect is not labile, blunt, angry or inappropriate.        Speech: Speech normal.        Behavior: Behavior normal.        Thought Content: Thought content normal. Thought content is not paranoid or delusional. Thought content does not include homicidal or suicidal ideation. Thought content does not include homicidal or suicidal plan.        Cognition and Memory: Cognition and memory normal.        Judgment: Judgment normal.     Comments: Insight intact     Lab Review:     Component Value Date/Time   NA 138 12/30/2017 1343   K 4.3 12/30/2017 1343   CL 101 12/30/2017 1343   CO2 27 12/30/2017 1336   GLUCOSE 87 12/30/2017 1343   BUN 13 12/30/2017 1343   CREATININE 0.90 12/30/2017 1343   CALCIUM 9.1  12/30/2017 1336   PROT 7.2 12/30/2017 1336   ALBUMIN 4.0 12/30/2017 1336   AST 22 12/30/2017 1336   ALT 16 12/30/2017 1336   ALKPHOS 50 12/30/2017 1336   BILITOT 0.7 12/30/2017 1336   GFRNONAA >60 12/30/2017 1336   GFRAA >60 12/30/2017 1336       Component Value Date/Time   WBC 5.9 12/30/2017 1336   RBC 4.35 12/30/2017 1336   HGB 14.3 12/30/2017 1343   HCT 42.0 12/30/2017 1343   PLT 275 12/30/2017 1336   MCV 94.9 12/30/2017 1336   MCH 30.6 12/30/2017 1336   MCHC 32.2 12/30/2017 1336  RDW 12.2 12/30/2017 1336   LYMPHSABS 1.8 12/30/2017 1336   MONOABS 0.6 12/30/2017 1336   EOSABS 0.2 12/30/2017 1336   BASOSABS 0.1 12/30/2017 1336    No results found for: POCLITH, LITHIUM   No results found for: PHENYTOIN, PHENOBARB, VALPROATE, CBMZ   .res Assessment: Plan:    Plan:  1. Adderall XR 30mg  daily 2. Adderall 30mg  daily 3. Wellbutrin XL 150mg  - 3 daily 4. Cymbalta 60mg  - 2 daily  5. Abilify 5mg  daily   RTC 4 weeks  Patient advised to contact office with any questions, adverse effects, or acute worsening in signs and symptoms.  Discussed potential metabolic side effects associated with atypical antipsychotics, as well as potential risk for movement side effects. Advised pt to contact office if movement side effects occur.   Discussed potential benefits, risks, and side effects of stimulants with patient to include increased heart rate, palpitations, insomnia, increased anxiety, increased irritability, or decreased appetite.  Instructed patient to contact office if experiencing any significant tolerability issues.  Diagnoses and all orders for this visit:  Bipolar II disorder (HCC)  Major depressive disorder, recurrent episode, moderate (HCC)  Generalized anxiety disorder  Attention deficit hyperactivity disorder (ADHD), unspecified ADHD type  Insomnia, unspecified type     Please see After Visit Summary for patient specific instructions.  No future  appointments.  No orders of the defined types were placed in this encounter.   -------------------------------

## 2019-02-16 DIAGNOSIS — F411 Generalized anxiety disorder: Secondary | ICD-10-CM | POA: Diagnosis not present

## 2019-03-09 DIAGNOSIS — F411 Generalized anxiety disorder: Secondary | ICD-10-CM | POA: Diagnosis not present

## 2019-03-30 DIAGNOSIS — F411 Generalized anxiety disorder: Secondary | ICD-10-CM | POA: Diagnosis not present

## 2019-04-20 DIAGNOSIS — F411 Generalized anxiety disorder: Secondary | ICD-10-CM | POA: Diagnosis not present

## 2019-04-29 ENCOUNTER — Ambulatory Visit (INDEPENDENT_AMBULATORY_CARE_PROVIDER_SITE_OTHER): Payer: BC Managed Care – PPO | Admitting: Adult Health

## 2019-04-29 ENCOUNTER — Encounter: Payer: Self-pay | Admitting: Adult Health

## 2019-04-29 ENCOUNTER — Other Ambulatory Visit: Payer: Self-pay

## 2019-04-29 DIAGNOSIS — F3181 Bipolar II disorder: Secondary | ICD-10-CM | POA: Diagnosis not present

## 2019-04-29 DIAGNOSIS — F411 Generalized anxiety disorder: Secondary | ICD-10-CM

## 2019-04-29 DIAGNOSIS — F909 Attention-deficit hyperactivity disorder, unspecified type: Secondary | ICD-10-CM | POA: Diagnosis not present

## 2019-04-29 DIAGNOSIS — F331 Major depressive disorder, recurrent, moderate: Secondary | ICD-10-CM | POA: Diagnosis not present

## 2019-04-29 MED ORDER — ARIPIPRAZOLE 5 MG PO TABS: 5.0000 mg | ORAL_TABLET | Freq: Every day | ORAL | 5 refills | Status: DC

## 2019-04-29 MED ORDER — AMPHETAMINE-DEXTROAMPHETAMINE 30 MG PO TABS: 30.0000 mg | ORAL_TABLET | Freq: Two times a day (BID) | ORAL | 0 refills | Status: DC

## 2019-04-29 MED ORDER — DULOXETINE HCL 60 MG PO CPEP: 120.0000 mg | ORAL_CAPSULE | Freq: Every day | ORAL | 5 refills | Status: DC

## 2019-04-29 MED ORDER — BUPROPION HCL ER (XL) 150 MG PO TB24: ORAL_TABLET | ORAL | 5 refills | Status: DC

## 2019-04-29 NOTE — Progress Notes (Signed)
Sandra Lyons [id number] [birth year] 49 y.o.  Subjective:   Patient ID:  Sandra Lyons is a 49 y.o. (DOB 08/24/1970) female.  Chief Complaint: No chief complaint on file.   HPI Sandra Lyons presents to the office today for follow-up of anxiety, depression, insomnia, Bipolar 2 disorder and ADHD.   Describes mood today as "ok". Pleasant. Mood symptoms - denies depression, anxiety, and irritability - stating "I'm doing alright". Getting out and doing things. She and husband doing well. Has felt tired in the mornings after taking the Adderall XR - 2 months. No prior issues with fatigue. Stable interest and motivation. Taking medications as prescribed.  Energy levels lower in the mornings. Active, does not have a regular exercise routine. Works full-time in Aeronautical engineer.  Enjoys some usual interests and activities. Married. Lives with husband of 4 years. Has one son age 73. Appetite adequate. Weight stable - 137 pounds.  Sleeps well most nights. Averages 6 hours. Focus and concentration stable. Completing tasks. Managing aspects of household. Work going well. Denies SI or HI. Denies AH or VH.   Review of Systems:  Review of Systems  Musculoskeletal: Negative for gait problem.  Neurological: Negative for tremors.  Psychiatric/Behavioral:       Please refer to HPI    Medications: I have reviewed the patient's current medications.  Current Outpatient Medications  Medication Sig Dispense Refill  . amphetamine-dextroamphetamine (ADDERALL XR) 30 MG 24 hr capsule Take 1 capsule (30 mg total) by mouth daily. 30 capsule 0  . amphetamine-dextroamphetamine (ADDERALL XR) 30 MG 24 hr capsule Take 1 capsule (30 mg total) by mouth daily. 30 capsule 0  . amphetamine-dextroamphetamine (ADDERALL XR) 30 MG 24 hr capsule Take 1 capsule (30 mg total) by mouth daily. 30 capsule 0  . amphetamine-dextroamphetamine (ADDERALL) 30 MG tablet Take 1 tablet by mouth daily. 30 tablet 0  .  amphetamine-dextroamphetamine (ADDERALL) 30 MG tablet Take 1 tablet by mouth daily. 30 tablet 0  . amphetamine-dextroamphetamine (ADDERALL) 30 MG tablet Take 1 tablet by mouth 2 (two) times daily. 60 tablet 0  . ARIPiprazole (ABILIFY) 5 MG tablet Take 1 tablet (5 mg total) by mouth daily. 30 tablet 5  . buPROPion (WELLBUTRIN XL) 150 MG 24 hr tablet Take three tablets every morning. 90 tablet 5  . DULoxetine (CYMBALTA) 60 MG capsule Take 2 capsules (120 mg total) by mouth daily. 60 capsule 5  . levothyroxine (SYNTHROID, LEVOTHROID) 125 MCG tablet Take 125 mcg by mouth daily.     No current facility-administered medications for this visit.    Medication Side Effects: None  Allergies:  Allergies  Allergen Reactions  . Sulfa Antibiotics     Childhood allergy.     No past medical history on file.  No family history on file.  Social History   Socioeconomic History  . Marital status: Married    Spouse name: Not on file  . Number of children: Not on file  . Years of education: Not on file  . Highest education level: Not on file  Occupational History  . Not on file  Tobacco Use  . Smoking status: Never Smoker  . Smokeless tobacco: Never Used  Substance and Sexual Activity  . Alcohol use: Yes  . Drug use: Not on file  . Sexual activity: Not on file  Other Topics Concern  . Not on file  Social History Narrative  . Not on file   Social Determinants of Health   Financial Resource Strain:   . Difficulty of Paying  Living Expenses:   Food Insecurity:   . Worried About Programme researcher, broadcasting/film/video in the Last Year:   . Barista in the Last Year:   Transportation Needs:   . Freight forwarder (Medical):   Marland Kitchen Lack of Transportation (Non-Medical):   Physical Activity:   . Days of Exercise per Week:   . Minutes of Exercise per Session:   Stress:   . Feeling of Stress :   Social Connections:   . Frequency of Communication with Friends and Family:   . Frequency of Social  Gatherings with Friends and Family:   . Attends Religious Services:   . Active Member of Clubs or Organizations:   . Attends Banker Meetings:   Marland Kitchen Marital Status:   Intimate Partner Violence:   . Fear of Current or Ex-Partner:   . Emotionally Abused:   Marland Kitchen Physically Abused:   . Sexually Abused:     Past Medical History, Surgical history, Social history, and Family history were reviewed and updated as appropriate.   Please see review of systems for further details on the patient's review from today.   Objective:   Physical Exam:  There were no vitals taken for this visit.  Physical Exam Constitutional:      General: She is not in acute distress. Musculoskeletal:        General: No deformity.  Neurological:     Mental Status: She is alert and oriented to person, place, and time.     Coordination: Coordination normal.  Psychiatric:        Attention and Perception: Attention and perception normal. She does not perceive auditory or visual hallucinations.        Mood and Affect: Mood normal. Mood is not anxious or depressed. Affect is not labile, blunt, angry or inappropriate.        Speech: Speech normal.        Behavior: Behavior normal.        Thought Content: Thought content normal. Thought content is not paranoid or delusional. Thought content does not include homicidal or suicidal ideation. Thought content does not include homicidal or suicidal plan.        Cognition and Memory: Cognition and memory normal.        Judgment: Judgment normal.     Comments: Insight intact     Lab Review:     Component Value Date/Time   NA 138 12/30/2017 1343   K 4.3 12/30/2017 1343   CL 101 12/30/2017 1343   CO2 27 12/30/2017 1336   GLUCOSE 87 12/30/2017 1343   BUN 13 12/30/2017 1343   CREATININE 0.90 12/30/2017 1343   CALCIUM 9.1 12/30/2017 1336   PROT 7.2 12/30/2017 1336   ALBUMIN 4.0 12/30/2017 1336   AST 22 12/30/2017 1336   ALT 16 12/30/2017 1336   ALKPHOS 50  12/30/2017 1336   BILITOT 0.7 12/30/2017 1336   GFRNONAA >60 12/30/2017 1336   GFRAA >60 12/30/2017 1336       Component Value Date/Time   WBC 5.9 12/30/2017 1336   RBC 4.35 12/30/2017 1336   HGB 14.3 12/30/2017 1343   HCT 42.0 12/30/2017 1343   PLT 275 12/30/2017 1336   MCV 94.9 12/30/2017 1336   MCH 30.6 12/30/2017 1336   MCHC 32.2 12/30/2017 1336   RDW 12.2 12/30/2017 1336   LYMPHSABS 1.8 12/30/2017 1336   MONOABS 0.6 12/30/2017 1336   EOSABS 0.2 12/30/2017 1336   BASOSABS 0.1 12/30/2017 1336  No results found for: POCLITH, LITHIUM   No results found for: PHENYTOIN, PHENOBARB, VALPROATE, CBMZ   .res Assessment: Plan:    Plan:  1. D/C Adderall XR 30mg  daily 2. Increase Adderall 30mg  daily to BID 3. Wellbutrin XL 150mg  - 3 daily 4. Cymbalta 60mg  - 2 daily  5. Abilify 5mg  daily   RTC 4 weeks  Patient advised to contact office with any questions, adverse effects, or acute worsening in signs and symptoms.  Discussed potential metabolic side effects associated with atypical antipsychotics, as well as potential risk for movement side effects. Advised pt to contact office if movement side effects occur.   Discussed potential benefits, risks, and side effects of stimulants with patient to include increased heart rate, palpitations, insomnia, increased anxiety, increased irritability, or decreased appetite.  Instructed patient to contact office if experiencing any significant tolerability issues.    Diagnoses and all orders for this visit:  Major depressive disorder, recurrent episode, moderate (HCC) -     DULoxetine (CYMBALTA) 60 MG capsule; Take 2 capsules (120 mg total) by mouth daily. -     buPROPion (WELLBUTRIN XL) 150 MG 24 hr tablet; Take three tablets every morning.  Generalized anxiety disorder -     DULoxetine (CYMBALTA) 60 MG capsule; Take 2 capsules (120 mg total) by mouth daily. -     buPROPion (WELLBUTRIN XL) 150 MG 24 hr tablet; Take three tablets  every morning.  Bipolar II disorder (HCC) -     ARIPiprazole (ABILIFY) 5 MG tablet; Take 1 tablet (5 mg total) by mouth daily.  Attention deficit hyperactivity disorder (ADHD), unspecified ADHD type -     amphetamine-dextroamphetamine (ADDERALL) 30 MG tablet; Take 1 tablet by mouth 2 (two) times daily.     Please see After Visit Summary for patient specific instructions.  No future appointments.  No orders of the defined types were placed in this encounter.   -------------------------------

## 2019-04-30 ENCOUNTER — Telehealth: Payer: Self-pay | Admitting: Adult Health

## 2019-04-30 NOTE — Telephone Encounter (Signed)
Prior authorization submitted and approved for quantity limit of Bupropion XL 150 mg 3 daily effective 03/31/2019-04/29/2022 Submitted with [facility]  PA# [id number]  Sandra Lyons through cover my meds

## 2019-04-30 NOTE — Telephone Encounter (Signed)
Pt called and stated the pharmacy only gave her a 10 day supply of WELLBUTRIN. Pharmacy stated the insurance would only pay for 10 day supply. Please reach out to pharmacy or insurance company.

## 2019-05-01 NOTE — Telephone Encounter (Signed)
Left patient message with information 

## 2019-05-25 DIAGNOSIS — F411 Generalized anxiety disorder: Secondary | ICD-10-CM | POA: Diagnosis not present

## 2019-07-01 DIAGNOSIS — R944 Abnormal results of kidney function studies: Secondary | ICD-10-CM | POA: Diagnosis not present

## 2019-07-01 DIAGNOSIS — E039 Hypothyroidism, unspecified: Secondary | ICD-10-CM | POA: Diagnosis not present

## 2019-07-03 ENCOUNTER — Telehealth: Payer: Self-pay | Admitting: Adult Health

## 2019-07-03 NOTE — Telephone Encounter (Signed)
Noted will check into this.  

## 2019-07-03 NOTE — Telephone Encounter (Signed)
[facility] called stating when PA was submitted for Bupropion HCL XL 150 mg #90 was entered as SR. Resubmit with XL.

## 2019-07-03 NOTE — Telephone Encounter (Signed)
Contacted [facility] they updated PA with XR instead of SR for Wellbutrin 150 mg, #270/90 day PA remains approved for quantity

## 2019-07-13 DIAGNOSIS — F411 Generalized anxiety disorder: Secondary | ICD-10-CM | POA: Diagnosis not present

## 2019-07-29 ENCOUNTER — Ambulatory Visit (INDEPENDENT_AMBULATORY_CARE_PROVIDER_SITE_OTHER): Payer: BC Managed Care – PPO | Admitting: Adult Health

## 2019-07-29 ENCOUNTER — Encounter: Payer: Self-pay | Admitting: Adult Health

## 2019-07-29 ENCOUNTER — Other Ambulatory Visit: Payer: Self-pay

## 2019-07-29 DIAGNOSIS — F909 Attention-deficit hyperactivity disorder, unspecified type: Secondary | ICD-10-CM | POA: Diagnosis not present

## 2019-07-29 DIAGNOSIS — F411 Generalized anxiety disorder: Secondary | ICD-10-CM

## 2019-07-29 DIAGNOSIS — F3181 Bipolar II disorder: Secondary | ICD-10-CM | POA: Diagnosis not present

## 2019-07-29 DIAGNOSIS — F331 Major depressive disorder, recurrent, moderate: Secondary | ICD-10-CM

## 2019-07-29 MED ORDER — AMPHETAMINE-DEXTROAMPHETAMINE 30 MG PO TABS: 30.0000 mg | ORAL_TABLET | Freq: Two times a day (BID) | ORAL | 0 refills | Status: DC

## 2019-07-29 NOTE — Progress Notes (Signed)
Sandra Lyons [id number] 09/14/1970 49 y.o.  Subjective:   Patient ID:  Sandra Lyons is a 49 y.o. (DOB February 09, 1970) female.  Chief Complaint: No chief complaint on file.   HPI Sandra Lyons presents to the office today for follow-up of anxiety, depression, insomnia, Bipolar 2 disorder and ADHD.   Describes mood today as "ok". Pleasant. Mood symptoms - denies depression, anxiety, and irritability. Stating "i'm doing pretty good". Feels like medications continue to work well to keep mood "level". She and husband doing well. Recent beach trip. Planning a vacation to [place] later in the year. Stable interest and motivation. Taking medications as prescribed.  Energy levels have improved. Active, does not have a regular exercise routine.  Enjoys some usual interests and activities. Married. Lives with husband of 4 years. Has one son age 84 - graduated from high school.  Appetite adequate. Weight stable - 136 pounds.  Sleeps well most nights. Averages 6 hours. Focus and concentration stable. Completing tasks. Managing aspects of household. 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  Denies SI or HI. Denies AH or VH.   Review of Systems:  Review of Systems  Musculoskeletal: Negative for gait problem.  Neurological: Negative for tremors.  Psychiatric/Behavioral:       Please refer to HPI    Medications: I have reviewed the patient's current medications.  Current Outpatient Medications  Medication Sig Dispense Refill  . amphetamine-dextroamphetamine (ADDERALL XR) 30 MG 24 hr capsule Take 1 capsule (30 mg total) by mouth daily. 30 capsule 0  . amphetamine-dextroamphetamine (ADDERALL XR) 30 MG 24 hr capsule Take 1 capsule (30 mg total) by mouth daily. 30 capsule 0  . amphetamine-dextroamphetamine (ADDERALL XR) 30 MG 24 hr capsule Take 1 capsule (30 mg total) by mouth daily. 30 capsule 0  . amphetamine-dextroamphetamine (ADDERALL) 30 MG tablet Take 1 tablet by mouth 2 (two) times  daily. 60 tablet 0  . [START ON 08/26/2019] amphetamine-dextroamphetamine (ADDERALL) 30 MG tablet Take 1 tablet by mouth 2 (two) times daily. 60 tablet 0  . [START ON 09/23/2019] amphetamine-dextroamphetamine (ADDERALL) 30 MG tablet Take 1 tablet by mouth 2 (two) times daily. 60 tablet 0  . ARIPiprazole (ABILIFY) 5 MG tablet Take 1 tablet (5 mg total) by mouth daily. 30 tablet 5  . buPROPion (WELLBUTRIN XL) 150 MG 24 hr tablet Take three tablets every morning. 90 tablet 5  . DULoxetine (CYMBALTA) 60 MG capsule Take 2 capsules (120 mg total) by mouth daily. 60 capsule 5  . levothyroxine (SYNTHROID, LEVOTHROID) 125 MCG tablet Take 125 mcg by mouth daily.     No current facility-administered medications for this visit.    Medication Side Effects: None  Allergies:  Allergies  Allergen Reactions  . Sulfa Antibiotics     Childhood allergy.     No past medical history on file.  No family history on file.  Social History   Socioeconomic History  . Marital status: Married    Spouse name: Not on file  . Number of children: Not on file  . Years of education: Not on file  . Highest education level: Not on file  Occupational History  . Not on file  Tobacco Use  . Smoking status: Never Smoker  . Smokeless tobacco: Never Used  Substance and Sexual Activity  . Alcohol use: Yes  . Drug use: Not on file  . Sexual activity: Not on file  Other Topics Concern  . Not on file  Social History Narrative  . Not on file  Social Determinants of Health   Financial Resource Strain:   . Difficulty of Paying Living Expenses:   Food Insecurity:   . Worried About Programme researcher, broadcasting/film/video in the Last Year:   . Barista in the Last Year:   Transportation Needs:   . Freight forwarder (Medical):   Marland Kitchen Lack of Transportation (Non-Medical):   Physical Activity:   . Days of Exercise per Week:   . Minutes of Exercise per Session:   Stress:   . Feeling of Stress :   Social Connections:   .  Frequency of Communication with Friends and Family:   . Frequency of Social Gatherings with Friends and Family:   . Attends Religious Services:   . Active Member of Clubs or Organizations:   . Attends Banker Meetings:   Marland Kitchen Marital Status:   Intimate Partner Violence:   . Fear of Current or Ex-Partner:   . Emotionally Abused:   Marland Kitchen Physically Abused:   . Sexually Abused:     Past Medical History, Surgical history, Social history, and Family history were reviewed and updated as appropriate.   Please see review of systems for further details on the patient's review from today.   Objective:   Physical Exam:  There were no vitals taken for this visit.  Physical Exam Constitutional:      General: She is not in acute distress. Musculoskeletal:        General: No deformity.  Neurological:     Mental Status: She is alert and oriented to person, place, and time.     Coordination: Coordination normal.  Psychiatric:        Attention and Perception: Attention and perception normal. She does not perceive auditory or visual hallucinations.        Mood and Affect: Mood normal. Mood is not anxious or depressed. Affect is not labile, blunt, angry or inappropriate.        Speech: Speech normal.        Behavior: Behavior normal.        Thought Content: Thought content normal. Thought content is not paranoid or delusional. Thought content does not include homicidal or suicidal ideation. Thought content does not include homicidal or suicidal plan.        Cognition and Memory: Cognition and memory normal.        Judgment: Judgment normal.     Comments: Insight intact     Lab Review:     Component Value Date/Time   NA 138 12/30/2017 1343   K 4.3 12/30/2017 1343   CL 101 12/30/2017 1343   CO2 27 12/30/2017 1336   GLUCOSE 87 12/30/2017 1343   BUN 13 12/30/2017 1343   CREATININE 0.90 12/30/2017 1343   CALCIUM 9.1 12/30/2017 1336   PROT 7.2 12/30/2017 1336   ALBUMIN 4.0  12/30/2017 1336   AST 22 12/30/2017 1336   ALT 16 12/30/2017 1336   ALKPHOS 50 12/30/2017 1336   BILITOT 0.7 12/30/2017 1336   GFRNONAA >60 12/30/2017 1336   GFRAA >60 12/30/2017 1336       Component Value Date/Time   WBC 5.9 12/30/2017 1336   RBC 4.35 12/30/2017 1336   HGB 14.3 12/30/2017 1343   HCT 42.0 12/30/2017 1343   PLT 275 12/30/2017 1336   MCV 94.9 12/30/2017 1336   MCH 30.6 12/30/2017 1336   MCHC 32.2 12/30/2017 1336   RDW 12.2 12/30/2017 1336   LYMPHSABS 1.8 12/30/2017 1336   MONOABS 0.6  12/30/2017 1336   EOSABS 0.2 12/30/2017 1336   BASOSABS 0.1 12/30/2017 1336    No results found for: POCLITH, LITHIUM   No results found for: PHENYTOIN, PHENOBARB, VALPROATE, CBMZ   .res Assessment: Plan:    Plan:  Adderall 30mg  daily to BID Wellbutrin XL 150mg  - 3 daily Cymbalta 60mg  - 2 daily  Abilify 5mg  daily   RTC 3 months  Patient advised to contact office with any questions, adverse effects, or acute worsening in signs and symptoms.  Discussed potential metabolic side effects associated with atypical antipsychotics, as well as potential risk for movement side effects. Advised pt to contact office if movement side effects occur.   Discussed potential benefits, risks, and side effects of stimulants with patient to include increased heart rate, palpitations, insomnia, increased anxiety, increased irritability, or decreased appetite.  Instructed patient to contact office if experiencing any significant tolerability issues.    Diagnoses and all orders for this visit:  Bipolar II disorder (HCC)  Attention deficit hyperactivity disorder (ADHD), unspecified ADHD type -     amphetamine-dextroamphetamine (ADDERALL) 30 MG tablet; Take 1 tablet by mouth 2 (two) times daily. -     amphetamine-dextroamphetamine (ADDERALL) 30 MG tablet; Take 1 tablet by mouth 2 (two) times daily. -     amphetamine-dextroamphetamine (ADDERALL) 30 MG tablet; Take 1 tablet by mouth 2 (two)  times daily.  Major depressive disorder, recurrent episode, moderate (HCC)  Generalized anxiety disorder     Please see After Visit Summary for patient specific instructions.  Future Appointments  Date Time Provider Department Center  10/26/2019  3:00 PM Ketan Renz, , NP [facility] None    No orders of the defined types were placed in this encounter.   -------------------------------

## 2019-08-31 DIAGNOSIS — F411 Generalized anxiety disorder: Secondary | ICD-10-CM | POA: Diagnosis not present

## 2019-09-14 DIAGNOSIS — F411 Generalized anxiety disorder: Secondary | ICD-10-CM | POA: Diagnosis not present

## 2019-09-28 DIAGNOSIS — F411 Generalized anxiety disorder: Secondary | ICD-10-CM | POA: Diagnosis not present

## 2019-09-28 DIAGNOSIS — E039 Hypothyroidism, unspecified: Secondary | ICD-10-CM | POA: Diagnosis not present

## 2019-10-26 ENCOUNTER — Ambulatory Visit (INDEPENDENT_AMBULATORY_CARE_PROVIDER_SITE_OTHER): Payer: BC Managed Care – PPO | Admitting: Adult Health

## 2019-10-26 ENCOUNTER — Encounter: Payer: Self-pay | Admitting: Adult Health

## 2019-10-26 ENCOUNTER — Other Ambulatory Visit: Payer: Self-pay

## 2019-10-26 DIAGNOSIS — F411 Generalized anxiety disorder: Secondary | ICD-10-CM | POA: Diagnosis not present

## 2019-10-26 DIAGNOSIS — F331 Major depressive disorder, recurrent, moderate: Secondary | ICD-10-CM

## 2019-10-26 DIAGNOSIS — F909 Attention-deficit hyperactivity disorder, unspecified type: Secondary | ICD-10-CM

## 2019-10-26 DIAGNOSIS — F3181 Bipolar II disorder: Secondary | ICD-10-CM

## 2019-10-26 MED ORDER — BUPROPION HCL ER (XL) 150 MG PO TB24: ORAL_TABLET | ORAL | 5 refills | Status: DC

## 2019-10-26 MED ORDER — ARIPIPRAZOLE 5 MG PO TABS: 5.0000 mg | ORAL_TABLET | Freq: Every day | ORAL | 5 refills | Status: DC

## 2019-10-26 MED ORDER — AMPHETAMINE-DEXTROAMPHETAMINE 30 MG PO TABS: 30.0000 mg | ORAL_TABLET | Freq: Two times a day (BID) | ORAL | 0 refills | Status: DC

## 2019-10-26 MED ORDER — DULOXETINE HCL 60 MG PO CPEP: 120.0000 mg | ORAL_CAPSULE | Freq: Every day | ORAL | 5 refills | Status: DC

## 2019-10-26 NOTE — Progress Notes (Signed)
Sandra Lyons [id number] Nov 27, 1970 49 y.o.  Subjective:   Patient ID:  Sandra Lyons is a 49 y.o. (DOB August 09, 1970) female.  Chief Complaint: No chief complaint on file.   HPI Sandra Lyons presents to the office today for follow-up of anxiety, depression, insomnia, Bipolar 2 disorder and ADHD.   Describes mood today as "ok". Pleasant. Mood symptoms - denies depression, anxiety, and irritability. Stating "everything is going alright". Feels like medications are working well. Upcoming vacation to [place]. She and husband doing well. Stable interest and motivation. Taking medications as prescribed.  Energy levels have improved. Active, does not have a regular exercise routine.  Enjoys some usual interests and activities. Married. Lives with husband of 4 years. Has one son age 42.  Appetite adequate. Weight stable - 136 pounds.  Sleeps well most nights. Averages 5 to 6 hours. Focus and concentration stable. Completing tasks. Managing aspects of household. 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  Denies SI or HI. Denies AH or VH.   Review of Systems:  Review of Systems  Musculoskeletal: Negative for gait problem.  Neurological: Negative for tremors.  Psychiatric/Behavioral:       Please refer to HPI    Medications: I have reviewed the patient's current medications.  Current Outpatient Medications  Medication Sig Dispense Refill  . amphetamine-dextroamphetamine (ADDERALL XR) 30 MG 24 hr capsule Take 1 capsule (30 mg total) by mouth daily. 30 capsule 0  . amphetamine-dextroamphetamine (ADDERALL XR) 30 MG 24 hr capsule Take 1 capsule (30 mg total) by mouth daily. 30 capsule 0  . amphetamine-dextroamphetamine (ADDERALL XR) 30 MG 24 hr capsule Take 1 capsule (30 mg total) by mouth daily. 30 capsule 0  . amphetamine-dextroamphetamine (ADDERALL) 30 MG tablet Take 1 tablet by mouth 2 (two) times daily. 60 tablet 0  . amphetamine-dextroamphetamine (ADDERALL) 30 MG tablet Take 1  tablet by mouth 2 (two) times daily. 60 tablet 0  . amphetamine-dextroamphetamine (ADDERALL) 30 MG tablet Take 1 tablet by mouth 2 (two) times daily. 60 tablet 0  . ARIPiprazole (ABILIFY) 5 MG tablet Take 1 tablet (5 mg total) by mouth daily. 30 tablet 5  . buPROPion (WELLBUTRIN XL) 150 MG 24 hr tablet Take three tablets every morning. 90 tablet 5  . DULoxetine (CYMBALTA) 60 MG capsule Take 2 capsules (120 mg total) by mouth daily. 60 capsule 5  . levothyroxine (SYNTHROID, LEVOTHROID) 125 MCG tablet Take 125 mcg by mouth daily.     No current facility-administered medications for this visit.    Medication Side Effects: None  Allergies:  Allergies  Allergen Reactions  . Sulfa Antibiotics     Childhood allergy.     No past medical history on file.  No family history on file.  Social History   Socioeconomic History  . Marital status: Married    Spouse name: Not on file  . Number of children: Not on file  . Years of education: Not on file  . Highest education level: Not on file  Occupational History  . Not on file  Tobacco Use  . Smoking status: Never Smoker  . Smokeless tobacco: Never Used  Substance and Sexual Activity  . Alcohol use: Yes  . Drug use: Not on file  . Sexual activity: Not on file  Other Topics Concern  . Not on file  Social History Narrative  . Not on file   Social Determinants of Health   Financial Resource Strain:   . Difficulty of Paying Living Expenses: Not on file  Food Insecurity:   . Worried About Programme researcher, broadcasting/film/video in the Last Year: Not on file  . Ran Out of Food in the Last Year: Not on file  Transportation Needs:   . Lack of Transportation (Medical): Not on file  . Lack of Transportation (Non-Medical): Not on file  Physical Activity:   . Days of Exercise per Week: Not on file  . Minutes of Exercise per Session: Not on file  Stress:   . Feeling of Stress : Not on file  Social Connections:   . Frequency of Communication with Friends  and Family: Not on file  . Frequency of Social Gatherings with Friends and Family: Not on file  . Attends Religious Services: Not on file  . Active Member of Clubs or Organizations: Not on file  . Attends Banker Meetings: Not on file  . Marital Status: Not on file  Intimate Partner Violence:   . Fear of Current or Ex-Partner: Not on file  . Emotionally Abused: Not on file  . Physically Abused: Not on file  . Sexually Abused: Not on file    Past Medical History, Surgical history, Social history, and Family history were reviewed and updated as appropriate.   Please see review of systems for further details on the patient's review from today.   Objective:   Physical Exam:  There were no vitals taken for this visit.  Physical Exam Constitutional:      General: She is not in acute distress. Musculoskeletal:        General: No deformity.  Neurological:     Mental Status: She is alert and oriented to person, place, and time.     Coordination: Coordination normal.  Psychiatric:        Attention and Perception: Attention and perception normal. She does not perceive auditory or visual hallucinations.        Mood and Affect: Mood normal. Mood is not anxious or depressed. Affect is not labile, blunt, angry or inappropriate.        Speech: Speech normal.        Behavior: Behavior normal.        Thought Content: Thought content normal. Thought content is not paranoid or delusional. Thought content does not include homicidal or suicidal ideation. Thought content does not include homicidal or suicidal plan.        Cognition and Memory: Cognition and memory normal.        Judgment: Judgment normal.     Comments: Insight intact     Lab Review:     Component Value Date/Time   NA 138 12/30/2017 1343   K 4.3 12/30/2017 1343   CL 101 12/30/2017 1343   CO2 27 12/30/2017 1336   GLUCOSE 87 12/30/2017 1343   BUN 13 12/30/2017 1343   CREATININE 0.90 12/30/2017 1343   CALCIUM  9.1 12/30/2017 1336   PROT 7.2 12/30/2017 1336   ALBUMIN 4.0 12/30/2017 1336   AST 22 12/30/2017 1336   ALT 16 12/30/2017 1336   ALKPHOS 50 12/30/2017 1336   BILITOT 0.7 12/30/2017 1336   GFRNONAA >60 12/30/2017 1336   GFRAA >60 12/30/2017 1336       Component Value Date/Time   WBC 5.9 12/30/2017 1336   RBC 4.35 12/30/2017 1336   HGB 14.3 12/30/2017 1343   HCT 42.0 12/30/2017 1343   PLT 275 12/30/2017 1336   MCV 94.9 12/30/2017 1336   MCH 30.6 12/30/2017 1336   MCHC 32.2 12/30/2017 1336  RDW 12.2 12/30/2017 1336   LYMPHSABS 1.8 12/30/2017 1336   MONOABS 0.6 12/30/2017 1336   EOSABS 0.2 12/30/2017 1336   BASOSABS 0.1 12/30/2017 1336    No results found for: POCLITH, LITHIUM   No results found for: PHENYTOIN, PHENOBARB, VALPROATE, CBMZ   .res Assessment: Plan:    Plan:  Adderall 30mg  daily to BID Wellbutrin XL 150mg  - 3 daily Cymbalta 60mg  - 2 daily  Abilify 5mg  daily   117/77/89  RTC 3 months  Patient advised to contact office with any questions, adverse effects, or acute worsening in signs and symptoms.  Discussed potential metabolic side effects associated with atypical antipsychotics, as well as potential risk for movement side effects. Advised pt to contact office if movement side effects occur.   Discussed potential benefits, risks, and side effects of stimulants with patient to include increased heart rate, palpitations, insomnia, increased anxiety, increased irritability, or decreased appetite.  Instructed patient to contact office if experiencing any significant tolerability issues.     Diagnoses and all orders for this visit:  Attention deficit hyperactivity disorder (ADHD), unspecified ADHD type  Bipolar II disorder (HCC)  Major depressive disorder, recurrent episode, moderate (HCC)  Generalized anxiety disorder     Please see After Visit Summary for patient specific instructions.  No future appointments.  No orders of the defined types  were placed in this encounter.   -------------------------------

## 2019-11-09 DIAGNOSIS — F411 Generalized anxiety disorder: Secondary | ICD-10-CM | POA: Diagnosis not present

## 2019-11-19 DIAGNOSIS — H2513 Age-related nuclear cataract, bilateral: Secondary | ICD-10-CM | POA: Diagnosis not present

## 2019-11-19 DIAGNOSIS — H40033 Anatomical narrow angle, bilateral: Secondary | ICD-10-CM | POA: Diagnosis not present

## 2019-11-19 DIAGNOSIS — H04123 Dry eye syndrome of bilateral lacrimal glands: Secondary | ICD-10-CM | POA: Diagnosis not present

## 2019-11-21 DIAGNOSIS — H16042 Marginal corneal ulcer, left eye: Secondary | ICD-10-CM | POA: Diagnosis not present

## 2019-11-25 DIAGNOSIS — H10013 Acute follicular conjunctivitis, bilateral: Secondary | ICD-10-CM | POA: Diagnosis not present

## 2020-01-25 ENCOUNTER — Encounter: Payer: Self-pay | Admitting: Adult Health

## 2020-01-25 ENCOUNTER — Ambulatory Visit (INDEPENDENT_AMBULATORY_CARE_PROVIDER_SITE_OTHER): Payer: BC Managed Care – PPO | Admitting: Adult Health

## 2020-01-25 ENCOUNTER — Other Ambulatory Visit: Payer: Self-pay

## 2020-01-25 DIAGNOSIS — G4700 Insomnia, unspecified: Secondary | ICD-10-CM | POA: Diagnosis not present

## 2020-01-25 DIAGNOSIS — F909 Attention-deficit hyperactivity disorder, unspecified type: Secondary | ICD-10-CM

## 2020-01-25 DIAGNOSIS — F3181 Bipolar II disorder: Secondary | ICD-10-CM | POA: Diagnosis not present

## 2020-01-25 DIAGNOSIS — F331 Major depressive disorder, recurrent, moderate: Secondary | ICD-10-CM | POA: Diagnosis not present

## 2020-01-25 DIAGNOSIS — F411 Generalized anxiety disorder: Secondary | ICD-10-CM

## 2020-01-25 MED ORDER — AMPHETAMINE-DEXTROAMPHETAMINE 30 MG PO TABS: 30.0000 mg | ORAL_TABLET | Freq: Two times a day (BID) | ORAL | 0 refills | Status: DC

## 2020-01-25 NOTE — Progress Notes (Signed)
[name] [id number] [birth year] 50 y.o.  Subjective:   Patient ID:  Sandra Lyons is a 50 y.o. (DOB 1970/02/19) female.  Chief Complaint: No chief complaint on file.   HPI Shylynn Bruning presents to the office today for follow-up of anxiety, depression, insomnia, Bipolar 2 disorder and ADHD.   Describes mood today as "ok". Pleasant. Mood symptoms - denies depression, anxiety, and irritability. Stating "I'm doing good". She and husband doing well. Enjoyed trip to [place]. Stable interest and motivation. Taking medications as prescribed.  Energy levels "pretty good". Active, does not have a regular exercise routine.  Enjoys some usual interests and activities. Married. Lives with husband of 4 years. Has one son age 22.  Appetite adequate. Weight stable - 136 pounds.  Sleeps better some nights than others. Averages 5 to 6 hours. Focus and concentration stable. Completing tasks. Managing aspects of household. 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  Denies SI or HI. Denies AH or VH.      Review of Systems:  Review of Systems  Musculoskeletal: Negative for gait problem.  Neurological: Negative for tremors.  Psychiatric/Behavioral:       Please refer to HPI    Medications: I have reviewed the patient's current medications.  Current Outpatient Medications  Medication Sig Dispense Refill  . amphetamine-dextroamphetamine (ADDERALL) 30 MG tablet Take 1 tablet by mouth 2 (two) times daily. 60 tablet 0  . [START ON 02/22/2020] amphetamine-dextroamphetamine (ADDERALL) 30 MG tablet Take 1 tablet by mouth 2 (two) times daily. 60 tablet 0  . [START ON 03/21/2020] amphetamine-dextroamphetamine (ADDERALL) 30 MG tablet Take 1 tablet by mouth 2 (two) times daily. 60 tablet 0  . ARIPiprazole (ABILIFY) 5 MG tablet Take 1 tablet (5 mg total) by mouth daily. 30 tablet 5  . buPROPion (WELLBUTRIN XL) 150 MG 24 hr tablet Take three tablets every morning. 90 tablet 5  . DULoxetine  (CYMBALTA) 60 MG capsule Take 2 capsules (120 mg total) by mouth daily. 60 capsule 5  . levothyroxine (SYNTHROID, LEVOTHROID) 125 MCG tablet Take 125 mcg by mouth daily.     No current facility-administered medications for this visit.    Medication Side Effects: None  Allergies:  Allergies  Allergen Reactions  . Sulfa Antibiotics     Childhood allergy.     No past medical history on file.  No family history on file.  Social History   Socioeconomic History  . Marital status: Married    Spouse name: Not on file  . Number of children: Not on file  . Years of education: Not on file  . Highest education level: Not on file  Occupational History  . Not on file  Tobacco Use  . Smoking status: Never Smoker  . Smokeless tobacco: Never Used  Substance and Sexual Activity  . Alcohol use: Yes  . Drug use: Not on file  . Sexual activity: Not on file  Other Topics Concern  . Not on file  Social History Narrative  . Not on file   Social Determinants of Health   Financial Resource Strain: Not on file  Food Insecurity: Not on file  Transportation Needs: Not on file  Physical Activity: Not on file  Stress: Not on file  Social Connections: Not on file  Intimate Partner Violence: Not on file    Past Medical History, Surgical history, Social history, and Family history were reviewed and updated as appropriate.   Please see review of systems for further details on the patient's review from  today.   Objective:   Physical Exam:  There were no vitals taken for this visit.  Physical Exam Constitutional:      General: She is not in acute distress. Musculoskeletal:        General: No deformity.  Neurological:     Mental Status: She is alert and oriented to person, place, and time.     Coordination: Coordination normal.  Psychiatric:        Attention and Perception: Attention and perception normal. She does not perceive auditory or visual hallucinations.        Mood and  Affect: Mood normal. Mood is not anxious or depressed. Affect is not labile, blunt, angry or inappropriate.        Speech: Speech normal.        Behavior: Behavior normal.        Thought Content: Thought content normal. Thought content is not paranoid or delusional. Thought content does not include homicidal or suicidal ideation. Thought content does not include homicidal or suicidal plan.        Cognition and Memory: Cognition and memory normal.        Judgment: Judgment normal.     Comments: Insight intact     Lab Review:     Component Value Date/Time   NA 138 12/30/2017 1343   K 4.3 12/30/2017 1343   CL 101 12/30/2017 1343   CO2 27 12/30/2017 1336   GLUCOSE 87 12/30/2017 1343   BUN 13 12/30/2017 1343   CREATININE 0.90 12/30/2017 1343   CALCIUM 9.1 12/30/2017 1336   PROT 7.2 12/30/2017 1336   ALBUMIN 4.0 12/30/2017 1336   AST 22 12/30/2017 1336   ALT 16 12/30/2017 1336   ALKPHOS 50 12/30/2017 1336   BILITOT 0.7 12/30/2017 1336   GFRNONAA >60 12/30/2017 1336   GFRAA >60 12/30/2017 1336       Component Value Date/Time   WBC 5.9 12/30/2017 1336   RBC 4.35 12/30/2017 1336   HGB 14.3 12/30/2017 1343   HCT 42.0 12/30/2017 1343   PLT 275 12/30/2017 1336   MCV 94.9 12/30/2017 1336   MCH 30.6 12/30/2017 1336   MCHC 32.2 12/30/2017 1336   RDW 12.2 12/30/2017 1336   LYMPHSABS 1.8 12/30/2017 1336   MONOABS 0.6 12/30/2017 1336   EOSABS 0.2 12/30/2017 1336   BASOSABS 0.1 12/30/2017 1336    No results found for: POCLITH, LITHIUM   No results found for: PHENYTOIN, PHENOBARB, VALPROATE, CBMZ   .res Assessment: Plan:    Plan:  Adderall 30mg  daily to BID Wellbutrin XL 150mg  - 3 daily Cymbalta 60mg  - 2 daily  Abilify 5mg  daily   117/80 - 84  RTC 3 months  Patient advised to contact office with any questions, adverse effects, or acute worsening in signs and symptoms.  Discussed potential metabolic side effects associated with atypical antipsychotics, as well as  potential risk for movement side effects. Advised pt to contact office if movement side effects occur.   Discussed potential benefits, risks, and side effects of stimulants with patient to include increased heart rate, palpitations, insomnia, increased anxiety, increased irritability, or decreased appetite.  Instructed patient to contact office if experiencing any significant tolerability issues.    Diagnoses and all orders for this visit:  Attention deficit hyperactivity disorder (ADHD), unspecified ADHD type -     amphetamine-dextroamphetamine (ADDERALL) 30 MG tablet; Take 1 tablet by mouth 2 (two) times daily. -     amphetamine-dextroamphetamine (ADDERALL) 30 MG tablet; Take 1 tablet  by mouth 2 (two) times daily. -     amphetamine-dextroamphetamine (ADDERALL) 30 MG tablet; Take 1 tablet by mouth 2 (two) times daily.     Please see After Visit Summary for patient specific instructions.  No future appointments.  No orders of the defined types were placed in this encounter.   -------------------------------

## 2020-02-04 DIAGNOSIS — F39 Unspecified mood [affective] disorder: Secondary | ICD-10-CM | POA: Diagnosis not present

## 2020-02-04 DIAGNOSIS — F4312 Post-traumatic stress disorder, chronic: Secondary | ICD-10-CM | POA: Diagnosis not present

## 2020-02-04 DIAGNOSIS — F902 Attention-deficit hyperactivity disorder, combined type: Secondary | ICD-10-CM | POA: Diagnosis not present

## 2020-03-21 DIAGNOSIS — F902 Attention-deficit hyperactivity disorder, combined type: Secondary | ICD-10-CM | POA: Diagnosis not present

## 2020-03-21 DIAGNOSIS — F39 Unspecified mood [affective] disorder: Secondary | ICD-10-CM | POA: Diagnosis not present

## 2020-03-21 DIAGNOSIS — F4312 Post-traumatic stress disorder, chronic: Secondary | ICD-10-CM | POA: Diagnosis not present

## 2020-03-23 DIAGNOSIS — H43393 Other vitreous opacities, bilateral: Secondary | ICD-10-CM | POA: Diagnosis not present

## 2020-03-23 DIAGNOSIS — H10023 Other mucopurulent conjunctivitis, bilateral: Secondary | ICD-10-CM | POA: Diagnosis not present

## 2020-04-20 DIAGNOSIS — F4312 Post-traumatic stress disorder, chronic: Secondary | ICD-10-CM | POA: Diagnosis not present

## 2020-04-20 DIAGNOSIS — F39 Unspecified mood [affective] disorder: Secondary | ICD-10-CM | POA: Diagnosis not present

## 2020-04-20 DIAGNOSIS — F902 Attention-deficit hyperactivity disorder, combined type: Secondary | ICD-10-CM | POA: Diagnosis not present

## 2020-04-25 ENCOUNTER — Other Ambulatory Visit: Payer: Self-pay

## 2020-04-25 ENCOUNTER — Ambulatory Visit (INDEPENDENT_AMBULATORY_CARE_PROVIDER_SITE_OTHER): Payer: BC Managed Care – PPO | Admitting: Adult Health

## 2020-04-25 ENCOUNTER — Encounter: Payer: Self-pay | Admitting: Adult Health

## 2020-04-25 DIAGNOSIS — F331 Major depressive disorder, recurrent, moderate: Secondary | ICD-10-CM | POA: Diagnosis not present

## 2020-04-25 DIAGNOSIS — F909 Attention-deficit hyperactivity disorder, unspecified type: Secondary | ICD-10-CM | POA: Diagnosis not present

## 2020-04-25 DIAGNOSIS — F3181 Bipolar II disorder: Secondary | ICD-10-CM

## 2020-04-25 DIAGNOSIS — F411 Generalized anxiety disorder: Secondary | ICD-10-CM | POA: Diagnosis not present

## 2020-04-25 MED ORDER — AMPHETAMINE-DEXTROAMPHETAMINE 30 MG PO TABS: 30.0000 mg | ORAL_TABLET | Freq: Two times a day (BID) | ORAL | 0 refills | Status: DC

## 2020-04-25 MED ORDER — DULOXETINE HCL 60 MG PO CPEP: 120.0000 mg | ORAL_CAPSULE | Freq: Every day | ORAL | 5 refills | Status: DC

## 2020-04-25 MED ORDER — ARIPIPRAZOLE 5 MG PO TABS: 5.0000 mg | ORAL_TABLET | Freq: Every day | ORAL | 5 refills | Status: DC

## 2020-04-25 MED ORDER — BUPROPION HCL ER (XL) 150 MG PO TB24: ORAL_TABLET | ORAL | 5 refills | Status: DC

## 2020-04-25 NOTE — Progress Notes (Signed)
Sandra Lyons [id number] April 20, 1970 50 y.o.  Subjective:   Patient ID:  Sandra Lyons is a 50 y.o. (DOB 1970/08/28) female.  Chief Complaint: No chief complaint on file.   HPI Sandra Lyons presents to the office today for follow-up of anxiety, depression, insomnia, Bipolar 2 disorder and ADHD.   Describes mood today as "ok". Pleasant. Mood symptoms - denies depression, anxiety, and irritability. Stating "I'm doing alright". She and husband doing well. Seeing PCP in a month for a yearly physical  Stable interest and motivation. Seeing a therapist.Taking medications as prescribed.  Energy levels stable. Active, does not have a regular exercise routine.  Enjoys some usual interests and activities. Married. Lives with husband of 4 years. Has one son age 76.  Appetite adequate. Weight stable - 136 pounds.  Sleeps better some nights than others. Averages 5 to 6 hours - wakes up some during the night. Focus and concentration stable. Completing tasks. Managing aspects of household. 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  Denies SI or HI. Denies AH or VH.   Review of Systems:  Review of Systems  Musculoskeletal: Negative for gait problem.  Neurological: Negative for tremors.  Psychiatric/Behavioral:       Please refer to HPI    Medications: I have reviewed the patient's current medications.  Current Outpatient Medications  Medication Sig Dispense Refill  . amphetamine-dextroamphetamine (ADDERALL) 30 MG tablet Take 1 tablet by mouth 2 (two) times daily. 60 tablet 0  . [START ON 05/23/2020] amphetamine-dextroamphetamine (ADDERALL) 30 MG tablet Take 1 tablet by mouth 2 (two) times daily. 60 tablet 0  . [START ON 06/20/2020] amphetamine-dextroamphetamine (ADDERALL) 30 MG tablet Take 1 tablet by mouth 2 (two) times daily. 60 tablet 0  . ARIPiprazole (ABILIFY) 5 MG tablet Take 1 tablet (5 mg total) by mouth daily. 30 tablet 5  . buPROPion (WELLBUTRIN XL) 150 MG 24 hr tablet Take  three tablets every morning. 90 tablet 5  . DULoxetine (CYMBALTA) 60 MG capsule Take 2 capsules (120 mg total) by mouth daily. 60 capsule 5  . levothyroxine (SYNTHROID, LEVOTHROID) 125 MCG tablet Take 125 mcg by mouth daily.     No current facility-administered medications for this visit.    Medication Side Effects: None  Allergies:  Allergies  Allergen Reactions  . Sulfa Antibiotics     Childhood allergy.     No past medical history on file.  Past Medical History, Surgical history, Social history, and Family history were reviewed and updated as appropriate.   Please see review of systems for further details on the patient's review from today.   Objective:   Physical Exam:  There were no vitals taken for this visit.  Physical Exam Constitutional:      General: She is not in acute distress. Musculoskeletal:        General: No deformity.  Neurological:     Mental Status: She is alert and oriented to person, place, and time.     Coordination: Coordination normal.  Psychiatric:        Attention and Perception: Attention and perception normal. She does not perceive auditory or visual hallucinations.        Mood and Affect: Mood normal. Mood is not anxious or depressed. Affect is not labile, blunt, angry or inappropriate.        Speech: Speech normal.        Behavior: Behavior normal.        Thought Content: Thought content normal. Thought content is not paranoid or  delusional. Thought content does not include homicidal or suicidal ideation. Thought content does not include homicidal or suicidal plan.        Cognition and Memory: Cognition and memory normal.        Judgment: Judgment normal.     Comments: Insight intact     Lab Review:     Component Value Date/Time   NA 138 12/30/2017 1343   K 4.3 12/30/2017 1343   CL 101 12/30/2017 1343   CO2 27 12/30/2017 1336   GLUCOSE 87 12/30/2017 1343   BUN 13 12/30/2017 1343   CREATININE 0.90 12/30/2017 1343   CALCIUM 9.1  12/30/2017 1336   PROT 7.2 12/30/2017 1336   ALBUMIN 4.0 12/30/2017 1336   AST 22 12/30/2017 1336   ALT 16 12/30/2017 1336   ALKPHOS 50 12/30/2017 1336   BILITOT 0.7 12/30/2017 1336   GFRNONAA >60 12/30/2017 1336   GFRAA >60 12/30/2017 1336       Component Value Date/Time   WBC 5.9 12/30/2017 1336   RBC 4.35 12/30/2017 1336   HGB 14.3 12/30/2017 1343   HCT 42.0 12/30/2017 1343   PLT 275 12/30/2017 1336   MCV 94.9 12/30/2017 1336   MCH 30.6 12/30/2017 1336   MCHC 32.2 12/30/2017 1336   RDW 12.2 12/30/2017 1336   LYMPHSABS 1.8 12/30/2017 1336   MONOABS 0.6 12/30/2017 1336   EOSABS 0.2 12/30/2017 1336   BASOSABS 0.1 12/30/2017 1336    No results found for: POCLITH, LITHIUM   No results found for: PHENYTOIN, PHENOBARB, VALPROATE, CBMZ   .res Assessment: Plan:    Plan:  Adderall 30mg  daily to BID Wellbutrin XL 150mg  - 3 daily Cymbalta 60mg  - 2 daily  Abilify 5mg  daily   117/80 - 84  RTC 3 months  Patient advised to contact office with any questions, adverse effects, or acute worsening in signs and symptoms.  Discussed potential metabolic side effects associated with atypical antipsychotics, as well as potential risk for movement side effects. Advised pt to contact office if movement side effects occur.   Discussed potential benefits, risks, and side effects of stimulants with patient to include increased heart rate, palpitations, insomnia, increased anxiety, increased irritability, or decreased appetite.  Instructed patient to contact office if experiencing any significant tolerability issues.   Diagnoses and all orders for this visit:  Bipolar II disorder (HCC) -     ARIPiprazole (ABILIFY) 5 MG tablet; Take 1 tablet (5 mg total) by mouth daily.  Major depressive disorder, recurrent episode, moderate (HCC) -     DULoxetine (CYMBALTA) 60 MG capsule; Take 2 capsules (120 mg total) by mouth daily. -     buPROPion (WELLBUTRIN XL) 150 MG 24 hr tablet; Take three  tablets every morning.  Generalized anxiety disorder -     DULoxetine (CYMBALTA) 60 MG capsule; Take 2 capsules (120 mg total) by mouth daily. -     buPROPion (WELLBUTRIN XL) 150 MG 24 hr tablet; Take three tablets every morning.  Attention deficit hyperactivity disorder (ADHD), unspecified ADHD type -     amphetamine-dextroamphetamine (ADDERALL) 30 MG tablet; Take 1 tablet by mouth 2 (two) times daily. -     amphetamine-dextroamphetamine (ADDERALL) 30 MG tablet; Take 1 tablet by mouth 2 (two) times daily. -     amphetamine-dextroamphetamine (ADDERALL) 30 MG tablet; Take 1 tablet by mouth 2 (two) times daily.     Please see After Visit Summary for patient specific instructions.  Future Appointments  Date Time Provider Department Center  04/25/2020  4:20 PM Shakira Los, Thereasa Solo, NP [facility] None    No orders of the defined types were placed in this encounter.   -------------------------------

## 2020-05-02 DIAGNOSIS — F39 Unspecified mood [affective] disorder: Secondary | ICD-10-CM | POA: Diagnosis not present

## 2020-05-02 DIAGNOSIS — F902 Attention-deficit hyperactivity disorder, combined type: Secondary | ICD-10-CM | POA: Diagnosis not present

## 2020-05-02 DIAGNOSIS — F4312 Post-traumatic stress disorder, chronic: Secondary | ICD-10-CM | POA: Diagnosis not present

## 2020-06-28 IMAGING — CT CT ABD-PELV W/ CM
2 of 5 series · 15 of 46 positions shown, 17 images · IV contrast (ISOVUE)
Comparison: None.

CLINICAL DATA: Periumbilical region abdominal pain.  Recent fall

EXAM:
CT ABDOMEN AND PELVIS WITH CONTRAST
TECHNIQUE: Multidetector CT imaging of the abdomen and pelvis was performed
using the standard protocol following bolus administration of
intravenous contrast.
CONTRAST:  100mL 6XWP8S-BNN IOPAMIDOL (6XWP8S-BNN) INJECTION 61%

[Series 2: axial st · axial · 0.76mm/px · z∈[-402,-38]mm · 12 of 85 slices shown, 14 images]
[im 6/85  soft-tissue]
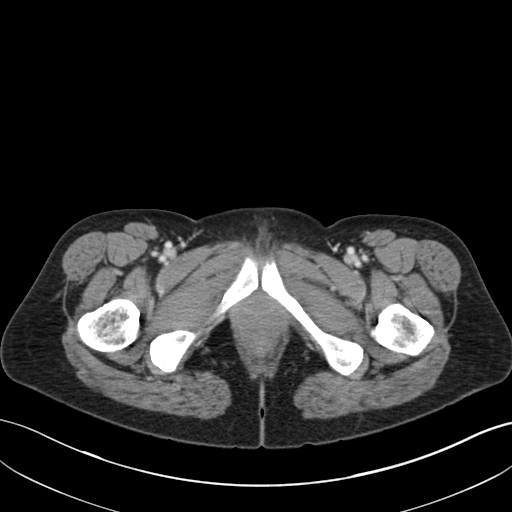
[im 6/85  bone]
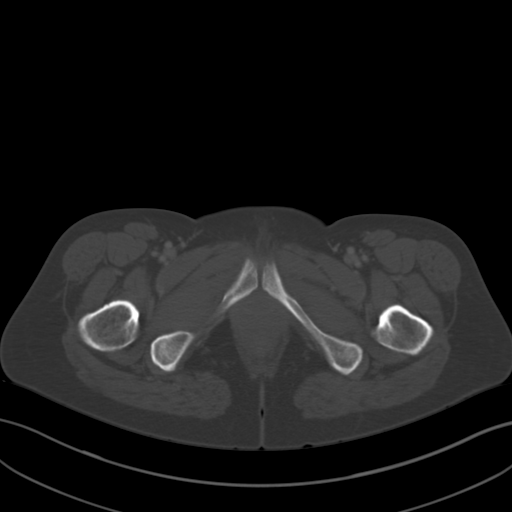
[im 11/85  soft-tissue]
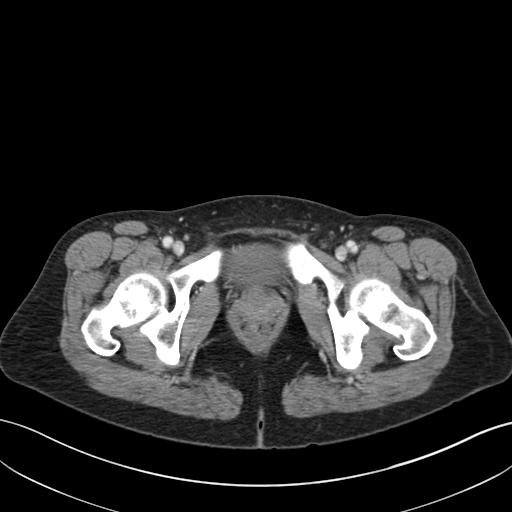
[im 22/85  soft-tissue]
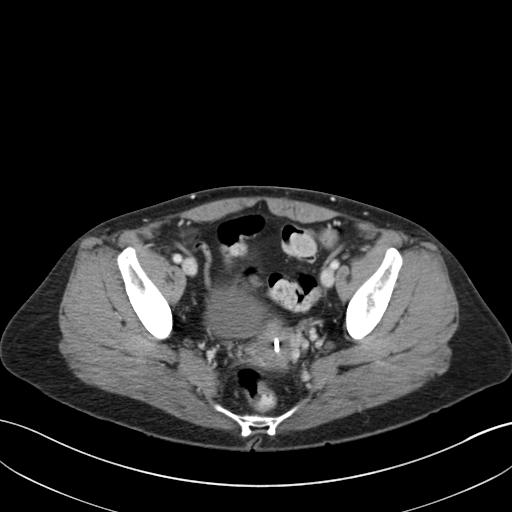
[im 27/85  soft-tissue]
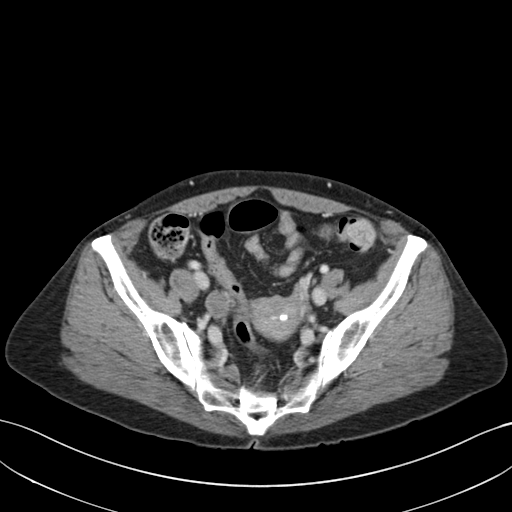
[im 32/85  soft-tissue]
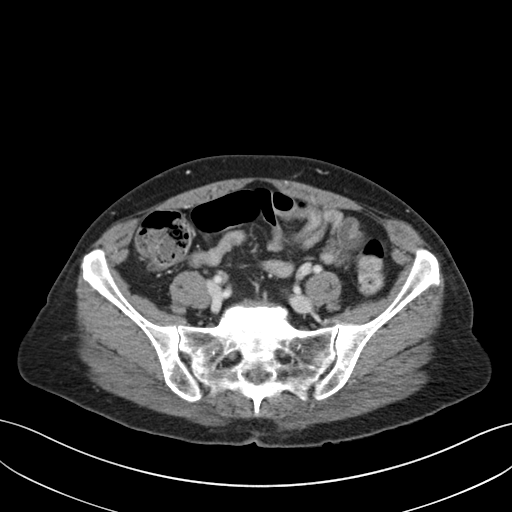
[im 37/85  soft-tissue]
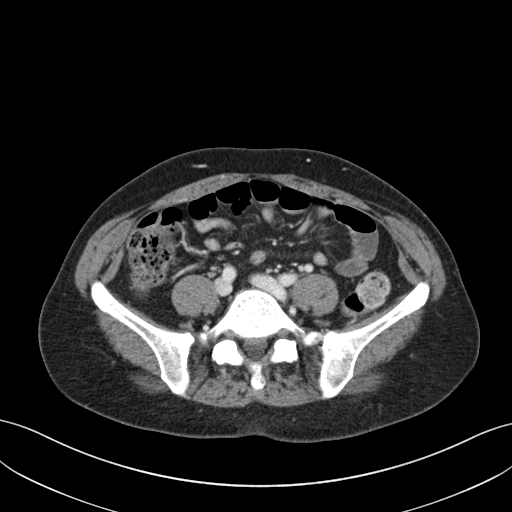
[im 48/85  soft-tissue]
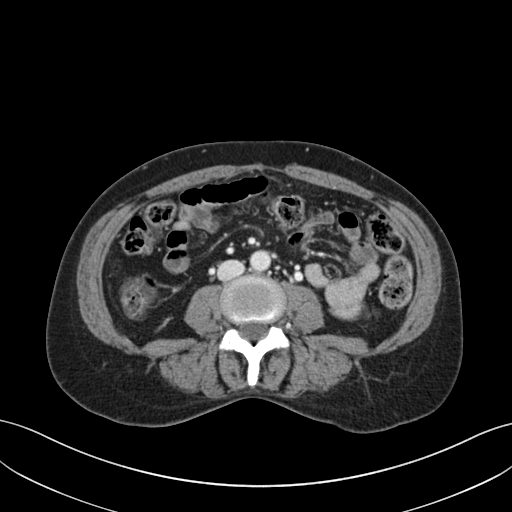
[im 53/85  soft-tissue]
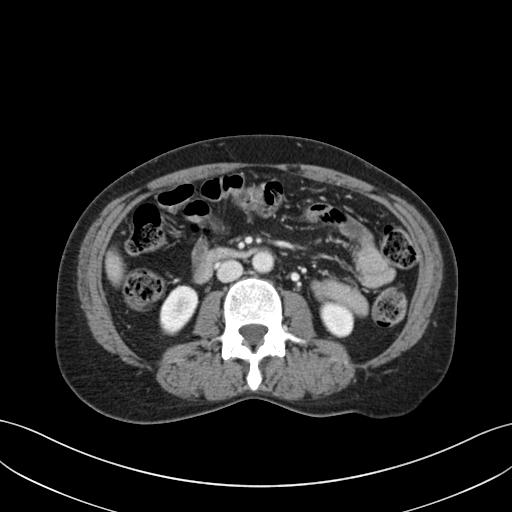
[im 58/85  soft-tissue]
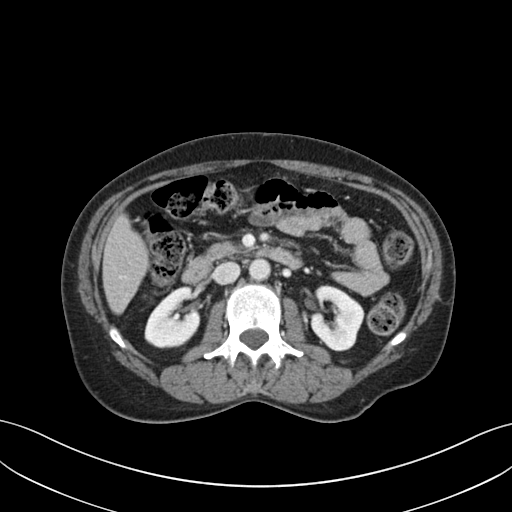
[im 58/85  bone]
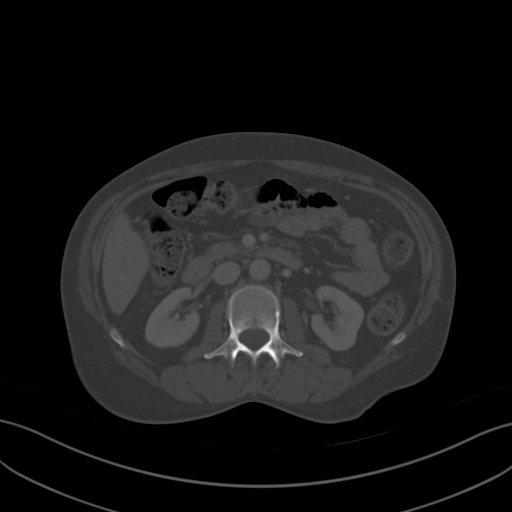
[im 64/85  soft-tissue]
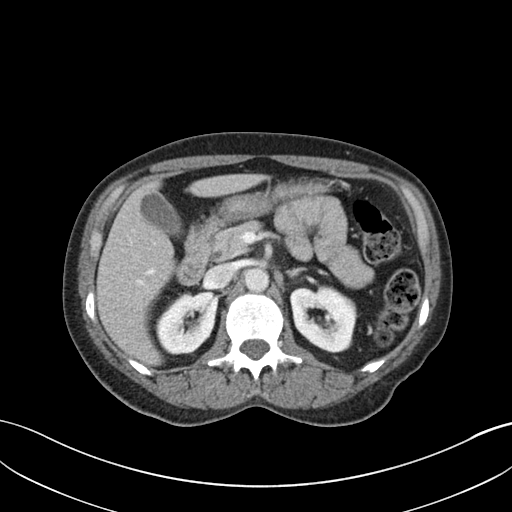
[im 74/85  soft-tissue]
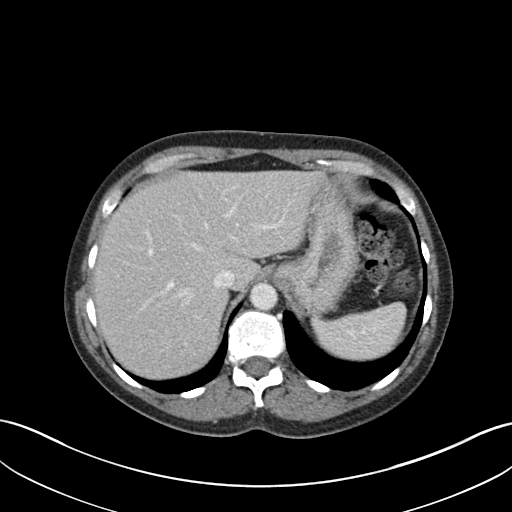
[im 79/85  soft-tissue]
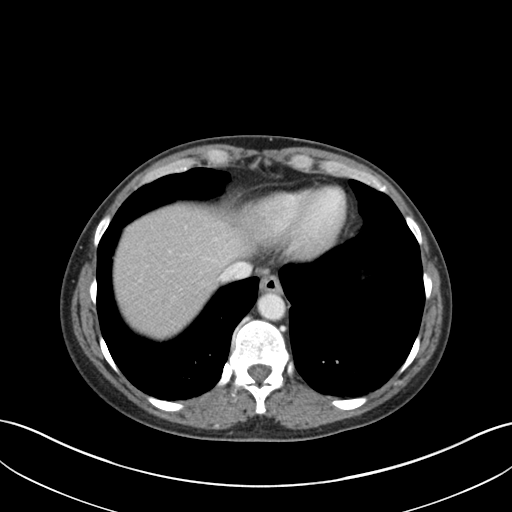

[Series 5: coronal st · coronal · 0.61mm/px · 3 of 73 slices shown]
[im 25/73  soft-tissue]
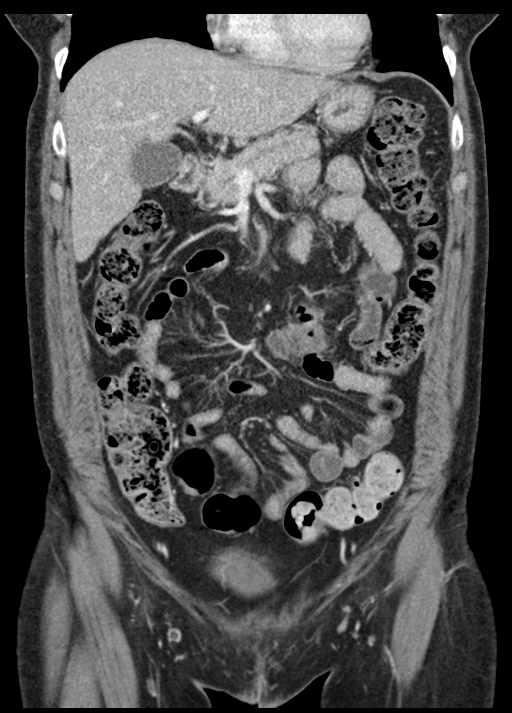
[im 33/73  soft-tissue]
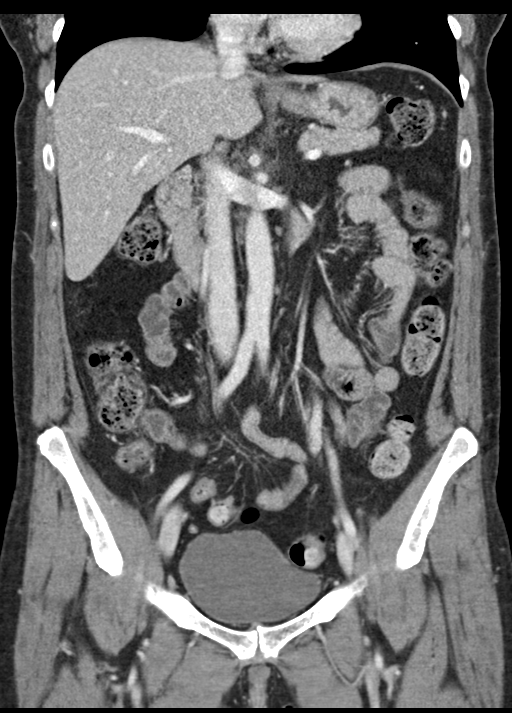
[im 41/73  soft-tissue]
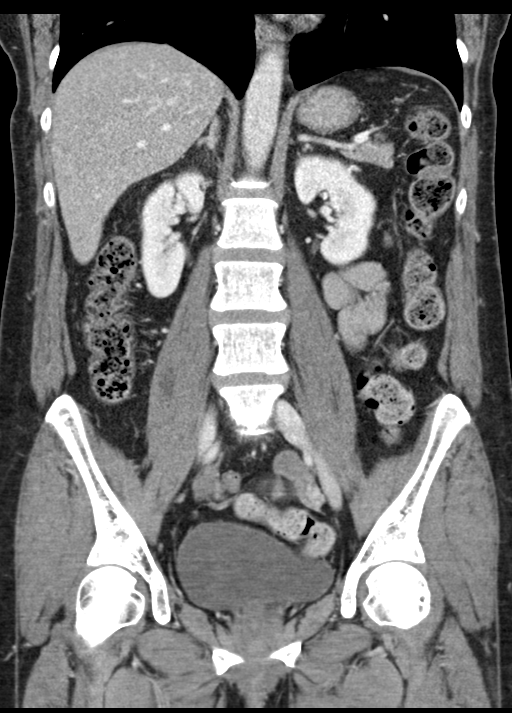

[15 of 46 positions shown; findings below may reference images not displayed]

FINDINGS: Lower chest: Lung bases are clear.  No basilar pneumothorax evident.

Hepatobiliary: There is a degree of hepatic steatosis. No focal
liver lesions are appreciable. No evident perihepatic fluid.
Gallbladder wall is not appreciably thickened. There is no biliary
duct dilatation.

Pancreas: No pancreatic mass or inflammatory focus.

Spleen: No splenic lesions are evident.  No perisplenic fluid.

Adrenals/Urinary Tract: Adrenals bilaterally appear unremarkable.
Kidneys bilaterally show no evident mass or hydronephrosis on either
side. No perinephric stranding or fluid on either side. There is no
appreciable renal or ureteral calculus on either side. Urinary
bladder is midline with wall thickness within normal limits.

Stomach/Bowel: There is moderate stool throughout the colon. There
is no appreciable bowel wall or mesenteric thickening. There is no
evident bowel obstruction. There is no evident free air or portal
venous air.

Vascular/Lymphatic: There is no abdominal aortic aneurysm. No
evident vascular lesions. There is no appreciable adenopathy in the
abdomen or pelvis.

Reproductive: The uterus is anteverted. There is an intrauterine
device positioned within the endometrium. There is no evident pelvic
mass.

Other: Appendix appears normal. There is no evident abscess or
ascites in the abdomen or pelvis.

Musculoskeletal: There is degenerative change at L5-S1 with vacuum
phenomenon. No evident blastic or lytic bone lesions. There is no
intramuscular or abdominal wall lesion evident.
IMPRESSION: 1. A cause for patient's symptoms has not been established with this
study.

2. No traumatic appearing lesion evident. Major viscera appear
intact. No abnormal fluid collections.

3. No appreciable bowel wall thickening or bowel obstruction. No
abscess in the abdomen or pelvis. Appendix appears normal.

4.  No evident renal or ureteral calculus.  No hydronephrosis.

5.  Intrauterine device positioned within the endometrium.

## 2020-07-25 ENCOUNTER — Encounter: Payer: Self-pay | Admitting: Adult Health

## 2020-07-25 ENCOUNTER — Ambulatory Visit: Payer: BC Managed Care – PPO | Admitting: Adult Health

## 2020-07-25 ENCOUNTER — Other Ambulatory Visit: Payer: Self-pay

## 2020-07-25 DIAGNOSIS — G4700 Insomnia, unspecified: Secondary | ICD-10-CM | POA: Diagnosis not present

## 2020-07-25 DIAGNOSIS — F331 Major depressive disorder, recurrent, moderate: Secondary | ICD-10-CM | POA: Diagnosis not present

## 2020-07-25 DIAGNOSIS — F3181 Bipolar II disorder: Secondary | ICD-10-CM | POA: Diagnosis not present

## 2020-07-25 DIAGNOSIS — F909 Attention-deficit hyperactivity disorder, unspecified type: Secondary | ICD-10-CM

## 2020-07-25 DIAGNOSIS — F411 Generalized anxiety disorder: Secondary | ICD-10-CM

## 2020-07-25 MED ORDER — AMPHETAMINE-DEXTROAMPHETAMINE 30 MG PO TABS: 30.0000 mg | ORAL_TABLET | Freq: Two times a day (BID) | ORAL | 0 refills | Status: DC

## 2020-07-25 NOTE — Progress Notes (Signed)
Sandra Lyons [id number] Jul 12, 1970 50 y.o.  Subjective:   Patient ID:  Sandra Lyons is a 50 y.o. (DOB Sep 10, 1970) female.  Chief Complaint: No chief complaint on file.   HPI Sandra Lyons presents to the office today for follow-up of anxiety, depression, insomnia, Bipolar 2 disorder and ADHD.   Describes mood today as "ok". Pleasant. Mood symptoms - denies depression, anxiety, and irritability. Stating "I'm doing pretty good". She and husband doing well. Seeing PCP for yearly physical  Stable interest and motivation. Seeing a therapist.Taking medications as prescribed.  Energy levels stable. Active, does not have a regular exercise routine.  Enjoys some usual interests and activities. Married. Lives with husband of 4 years. Has one son age 59.  Appetite adequate. Weight loss - 3 pounds - 133 pounds.  Sleeps better some nights than others. Averages 5 to 6 hours - not waking up as much. Focus and concentration stable. Completing tasks. Managing aspects of household. 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  Denies SI or HI. Denies AH or VH.  Review of Systems:  Review of Systems  Musculoskeletal:  Negative for gait problem.  Neurological:  Negative for tremors.  Psychiatric/Behavioral:         Please refer to HPI   Medications: I have reviewed the patient's current medications.  Current Outpatient Medications  Medication Sig Dispense Refill   amphetamine-dextroamphetamine (ADDERALL) 30 MG tablet Take 1 tablet by mouth 2 (two) times daily. 60 tablet 0   [START ON 08/22/2020] amphetamine-dextroamphetamine (ADDERALL) 30 MG tablet Take 1 tablet by mouth 2 (two) times daily. 60 tablet 0   [START ON 09/19/2020] amphetamine-dextroamphetamine (ADDERALL) 30 MG tablet Take 1 tablet by mouth 2 (two) times daily. 60 tablet 0   ARIPiprazole (ABILIFY) 5 MG tablet Take 1 tablet (5 mg total) by mouth daily. 30 tablet 5   buPROPion (WELLBUTRIN XL) 150 MG 24 hr tablet Take three tablets  every morning. 90 tablet 5   DULoxetine (CYMBALTA) 60 MG capsule Take 2 capsules (120 mg total) by mouth daily. 60 capsule 5   levothyroxine (SYNTHROID, LEVOTHROID) 125 MCG tablet Take 125 mcg by mouth daily.     No current facility-administered medications for this visit.    Medication Side Effects: None  Allergies:  Allergies  Allergen Reactions   Sulfa Antibiotics     Childhood allergy.     No past medical history on file.  Past Medical History, Surgical history, Social history, and Family history were reviewed and updated as appropriate.   Please see review of systems for further details on the patient's review from today.   Objective:   Physical Exam:  There were no vitals taken for this visit.  Physical Exam Constitutional:      General: She is not in acute distress. Musculoskeletal:        General: No deformity.  Neurological:     Mental Status: She is alert and oriented to person, place, and time.     Coordination: Coordination normal.  Psychiatric:        Attention and Perception: Attention and perception normal. She does not perceive auditory or visual hallucinations.        Mood and Affect: Mood normal. Mood is not anxious or depressed. Affect is not labile, blunt, angry or inappropriate.        Speech: Speech normal.        Behavior: Behavior normal.        Thought Content: Thought content normal. Thought content is not paranoid  or delusional. Thought content does not include homicidal or suicidal ideation. Thought content does not include homicidal or suicidal plan.        Cognition and Memory: Cognition and memory normal.        Judgment: Judgment normal.     Comments: Insight intact    Lab Review:     Component Value Date/Time   NA 138 12/30/2017 1343   K 4.3 12/30/2017 1343   CL 101 12/30/2017 1343   CO2 27 12/30/2017 1336   GLUCOSE 87 12/30/2017 1343   BUN 13 12/30/2017 1343   CREATININE 0.90 12/30/2017 1343   CALCIUM 9.1 12/30/2017 1336    PROT 7.2 12/30/2017 1336   ALBUMIN 4.0 12/30/2017 1336   AST 22 12/30/2017 1336   ALT 16 12/30/2017 1336   ALKPHOS 50 12/30/2017 1336   BILITOT 0.7 12/30/2017 1336   GFRNONAA >60 12/30/2017 1336   GFRAA >60 12/30/2017 1336       Component Value Date/Time   WBC 5.9 12/30/2017 1336   RBC 4.35 12/30/2017 1336   HGB 14.3 12/30/2017 1343   HCT 42.0 12/30/2017 1343   PLT 275 12/30/2017 1336   MCV 94.9 12/30/2017 1336   MCH 30.6 12/30/2017 1336   MCHC 32.2 12/30/2017 1336   RDW 12.2 12/30/2017 1336   LYMPHSABS 1.8 12/30/2017 1336   MONOABS 0.6 12/30/2017 1336   EOSABS 0.2 12/30/2017 1336   BASOSABS 0.1 12/30/2017 1336    No results found for: POCLITH, LITHIUM   No results found for: PHENYTOIN, PHENOBARB, VALPROATE, CBMZ   .res Assessment: Plan:    Plan:  Adderall 30mg  daily to BID Wellbutrin XL 150mg  - 3 daily Cymbalta 60mg  - 2 daily  Abilify 5mg  daily  117/76/71  RTC 3 months  Patient advised to contact office with any questions, adverse effects, or acute worsening in signs and symptoms.  Discussed potential metabolic side effects associated with atypical antipsychotics, as well as potential risk for movement side effects. Advised pt to contact office if movement side effects occur.   Discussed potential benefits, risks, and side effects of stimulants with patient to include increased heart rate, palpitations, insomnia, increased anxiety, increased irritability, or decreased appetite.  Instructed patient to contact office if experiencing any significant tolerability issues. Diagnoses and all orders for this visit:  Bipolar II disorder (HCC)  Attention deficit hyperactivity disorder (ADHD), unspecified ADHD type -     amphetamine-dextroamphetamine (ADDERALL) 30 MG tablet; Take 1 tablet by mouth 2 (two) times daily. -     amphetamine-dextroamphetamine (ADDERALL) 30 MG tablet; Take 1 tablet by mouth 2 (two) times daily. -     amphetamine-dextroamphetamine (ADDERALL) 30  MG tablet; Take 1 tablet by mouth 2 (two) times daily.  Major depressive disorder, recurrent episode, moderate (HCC)  Insomnia, unspecified type  Generalized anxiety disorder    Please see After Visit Summary for patient specific instructions.  Future Appointments  Date Time Provider Department Center  10/24/2020  3:00 PM Lyndel Sarate, , NP [facility] None     No orders of the defined types were placed in this encounter.   -------------------------------

## 2020-10-03 ENCOUNTER — Telehealth: Payer: Self-pay | Admitting: Adult Health

## 2020-10-03 ENCOUNTER — Other Ambulatory Visit: Payer: Self-pay

## 2020-10-03 DIAGNOSIS — F909 Attention-deficit hyperactivity disorder, unspecified type: Secondary | ICD-10-CM

## 2020-10-03 MED ORDER — AMPHETAMINE-DEXTROAMPHETAMINE 30 MG PO TABS: 30.0000 mg | ORAL_TABLET | Freq: Two times a day (BID) | ORAL | 0 refills | Status: DC

## 2020-10-03 NOTE — Telephone Encounter (Signed)
Pended.

## 2020-10-03 NOTE — Telephone Encounter (Signed)
Pt called and said that she needs a refill on her adderall 30 mg. She needs sent to [facility] [address]. She has been out of couple of days. She has an appt 10/25

## 2020-10-24 ENCOUNTER — Encounter: Payer: Self-pay | Admitting: Adult Health

## 2020-10-24 ENCOUNTER — Other Ambulatory Visit: Payer: Self-pay

## 2020-10-24 ENCOUNTER — Ambulatory Visit: Payer: BC Managed Care – PPO | Admitting: Adult Health

## 2020-10-24 DIAGNOSIS — F331 Major depressive disorder, recurrent, moderate: Secondary | ICD-10-CM | POA: Diagnosis not present

## 2020-10-24 DIAGNOSIS — F411 Generalized anxiety disorder: Secondary | ICD-10-CM

## 2020-10-24 DIAGNOSIS — F3181 Bipolar II disorder: Secondary | ICD-10-CM

## 2020-10-24 DIAGNOSIS — F909 Attention-deficit hyperactivity disorder, unspecified type: Secondary | ICD-10-CM | POA: Diagnosis not present

## 2020-10-24 MED ORDER — DULOXETINE HCL 60 MG PO CPEP: 120.0000 mg | ORAL_CAPSULE | Freq: Every day | ORAL | 5 refills | Status: DC

## 2020-10-24 MED ORDER — AMPHETAMINE-DEXTROAMPHETAMINE 30 MG PO TABS: 30.0000 mg | ORAL_TABLET | Freq: Two times a day (BID) | ORAL | 0 refills | Status: DC

## 2020-10-24 MED ORDER — BUPROPION HCL ER (XL) 150 MG PO TB24: ORAL_TABLET | ORAL | 5 refills | Status: DC

## 2020-10-24 MED ORDER — ARIPIPRAZOLE 5 MG PO TABS: 5.0000 mg | ORAL_TABLET | Freq: Every day | ORAL | 5 refills | Status: DC

## 2020-10-24 NOTE — Progress Notes (Signed)
Sandra Lyons [id number] [birth year] 50 y.o.  Subjective:   Patient ID:  Sandra Lyons is a 50 y.o. (DOB 09/17/1970) female.  Chief Complaint: No chief complaint on file.   HPI Sandra Lyons presents to the office today for follow-up of anxiety, depression, insomnia, Bipolar 2 disorder and ADHD.   Describes mood today as "ok". Pleasant. Mood symptoms - denies depression, anxiety, and irritability. Stating "I'm doing ok". Feels like medications continue to work well for her. Dog recently diagnosed with cancer. She and husband doing well. Stable interest and motivation. Seeing a therapist.Taking medications as prescribed.  Energy levels stable. Active, does not have a regular exercise routine.  Enjoys some usual interests and activities. Married. Lives with husband of 4 years. Has one son age 28.  Appetite adequate. Weight stable - 133 pounds.  Sleeps well most nights. Averages 5 to 6 hours. Focus and concentration stable. Completing tasks. Managing aspects of household. 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  Denies SI or HI. Denies AH or VH.   Review of Systems:  Review of Systems  Musculoskeletal:  Negative for gait problem.  Neurological:  Negative for tremors.  Psychiatric/Behavioral:         Please refer to HPI   Medications: I have reviewed the patient's current medications.  Current Outpatient Medications  Medication Sig Dispense Refill   amphetamine-dextroamphetamine (ADDERALL) 30 MG tablet Take 1 tablet by mouth 2 (two) times daily. 60 tablet 0   [START ON 11/21/2020] amphetamine-dextroamphetamine (ADDERALL) 30 MG tablet Take 1 tablet by mouth 2 (two) times daily. 60 tablet 0   [START ON 12/19/2020] amphetamine-dextroamphetamine (ADDERALL) 30 MG tablet Take 1 tablet by mouth 2 (two) times daily. 60 tablet 0   ARIPiprazole (ABILIFY) 5 MG tablet Take 1 tablet (5 mg total) by mouth daily. 30 tablet 5   buPROPion (WELLBUTRIN XL) 150 MG 24 hr tablet Take three  tablets every morning. 90 tablet 5   DULoxetine (CYMBALTA) 60 MG capsule Take 2 capsules (120 mg total) by mouth daily. 60 capsule 5   levothyroxine (SYNTHROID, LEVOTHROID) 125 MCG tablet Take 125 mcg by mouth daily.     No current facility-administered medications for this visit.    Medication Side Effects: None  Allergies:  Allergies  Allergen Reactions   Sulfa Antibiotics     Childhood allergy.     No past medical history on file.  Past Medical History, Surgical history, Social history, and Family history were reviewed and updated as appropriate.   Please see review of systems for further details on the patient's review from today.   Objective:   Physical Exam:  There were no vitals taken for this visit.  Physical Exam Constitutional:      General: She is not in acute distress. Musculoskeletal:        General: No deformity.  Neurological:     Mental Status: She is alert and oriented to person, place, and time.     Coordination: Coordination normal.  Psychiatric:        Attention and Perception: Attention and perception normal. She does not perceive auditory or visual hallucinations.        Mood and Affect: Mood normal. Mood is not anxious or depressed. Affect is not labile, blunt, angry or inappropriate.        Speech: Speech normal.        Behavior: Behavior normal.        Thought Content: Thought content normal. Thought content is not paranoid or delusional. Thought  content does not include homicidal or suicidal ideation. Thought content does not include homicidal or suicidal plan.        Cognition and Memory: Cognition and memory normal.        Judgment: Judgment normal.     Comments: Insight intact    Lab Review:     Component Value Date/Time   NA 138 12/30/2017 1343   K 4.3 12/30/2017 1343   CL 101 12/30/2017 1343   CO2 27 12/30/2017 1336   GLUCOSE 87 12/30/2017 1343   BUN 13 12/30/2017 1343   CREATININE 0.90 12/30/2017 1343   CALCIUM 9.1 12/30/2017  1336   PROT 7.2 12/30/2017 1336   ALBUMIN 4.0 12/30/2017 1336   AST 22 12/30/2017 1336   ALT 16 12/30/2017 1336   ALKPHOS 50 12/30/2017 1336   BILITOT 0.7 12/30/2017 1336   GFRNONAA >60 12/30/2017 1336   GFRAA >60 12/30/2017 1336       Component Value Date/Time   WBC 5.9 12/30/2017 1336   RBC 4.35 12/30/2017 1336   HGB 14.3 12/30/2017 1343   HCT 42.0 12/30/2017 1343   PLT 275 12/30/2017 1336   MCV 94.9 12/30/2017 1336   MCH 30.6 12/30/2017 1336   MCHC 32.2 12/30/2017 1336   RDW 12.2 12/30/2017 1336   LYMPHSABS 1.8 12/30/2017 1336   MONOABS 0.6 12/30/2017 1336   EOSABS 0.2 12/30/2017 1336   BASOSABS 0.1 12/30/2017 1336    No results found for: POCLITH, LITHIUM   No results found for: PHENYTOIN, PHENOBARB, VALPROATE, CBMZ   .res Assessment: Plan:     Plan:  Adderall 30mg  daily to BID Wellbutrin XL 150mg  - 3 daily Cymbalta 60mg  - 2 daily  Abilify 5mg  daily   Monitor BP between visits.  RTC 3 months  Patient advised to contact office with any questions, adverse effects, or acute worsening in signs and symptoms.  Discussed potential metabolic side effects associated with atypical antipsychotics, as well as potential risk for movement side effects. Advised pt to contact office if movement side effects occur.   Discussed potential benefits, risks, and side effects of stimulants with patient to include increased heart rate, palpitations, insomnia, increased anxiety, increased irritability, or decreased appetite.  Instructed patient to contact office if experiencing any significant tolerability issues. Diagnoses and all orders for this visit: Diagnoses and all orders for this visit:  Bipolar II disorder (HCC) -     ARIPiprazole (ABILIFY) 5 MG tablet; Take 1 tablet (5 mg total) by mouth daily.  Major depressive disorder, recurrent episode, moderate (HCC) -     DULoxetine (CYMBALTA) 60 MG capsule; Take 2 capsules (120 mg total) by mouth daily. -     buPROPion  (WELLBUTRIN XL) 150 MG 24 hr tablet; Take three tablets every morning.  Generalized anxiety disorder -     DULoxetine (CYMBALTA) 60 MG capsule; Take 2 capsules (120 mg total) by mouth daily. -     buPROPion (WELLBUTRIN XL) 150 MG 24 hr tablet; Take three tablets every morning.  Attention deficit hyperactivity disorder (ADHD), unspecified ADHD type -     amphetamine-dextroamphetamine (ADDERALL) 30 MG tablet; Take 1 tablet by mouth 2 (two) times daily. -     amphetamine-dextroamphetamine (ADDERALL) 30 MG tablet; Take 1 tablet by mouth 2 (two) times daily. -     amphetamine-dextroamphetamine (ADDERALL) 30 MG tablet; Take 1 tablet by mouth 2 (two) times daily.    Please see After Visit Summary for patient specific instructions.  No future appointments.  No orders  of the defined types were placed in this encounter.   -------------------------------

## 2020-11-09 ENCOUNTER — Telehealth: Payer: Self-pay | Admitting: Adult Health

## 2020-11-09 ENCOUNTER — Other Ambulatory Visit: Payer: Self-pay

## 2020-11-09 DIAGNOSIS — F909 Attention-deficit hyperactivity disorder, unspecified type: Secondary | ICD-10-CM

## 2020-11-09 NOTE — Telephone Encounter (Signed)
Patient called in regards to her prescription for Adderall 30mg . States that regular pharmacy is out of stock and pharmacy told her to call  CR to have prescription sent to a different pharmacy. They also asked if it could be done ASAP as they're almost out. Ph: [phone]. Appt 01/23/21. Pharmacy [facility] [address]

## 2020-11-09 NOTE — Telephone Encounter (Signed)
Cancelled and pended  

## 2020-11-10 MED ORDER — AMPHETAMINE-DEXTROAMPHETAMINE 30 MG PO TABS: 30.0000 mg | ORAL_TABLET | Freq: Two times a day (BID) | ORAL | 0 refills | Status: DC

## 2020-11-10 NOTE — Telephone Encounter (Signed)
Pt called back for status on this refill.

## 2020-11-10 NOTE — Telephone Encounter (Signed)
Patient notified that script was received at 8:04 this morning at the requested pharmacy.

## 2020-12-19 ENCOUNTER — Telehealth: Payer: Self-pay | Admitting: Adult Health

## 2020-12-19 ENCOUNTER — Other Ambulatory Visit: Payer: Self-pay

## 2020-12-19 DIAGNOSIS — F909 Attention-deficit hyperactivity disorder, unspecified type: Secondary | ICD-10-CM

## 2020-12-19 MED ORDER — AMPHETAMINE-DEXTROAMPHETAMINE 30 MG PO TABS: 30.0000 mg | ORAL_TABLET | Freq: Two times a day (BID) | ORAL | 0 refills | Status: DC

## 2020-12-19 NOTE — Telephone Encounter (Signed)
It was cancelled because she asked for it to be sent to a new pharmacy,I pended it again

## 2020-12-19 NOTE — Telephone Encounter (Signed)
Pt called stating Adderall Rx for [facility] [address] was [place] per Pharmacy. Please check into this

## 2021-01-18 ENCOUNTER — Other Ambulatory Visit: Payer: Self-pay

## 2021-01-18 ENCOUNTER — Telehealth: Payer: Self-pay | Admitting: Adult Health

## 2021-01-18 DIAGNOSIS — F331 Major depressive disorder, recurrent, moderate: Secondary | ICD-10-CM

## 2021-01-18 DIAGNOSIS — F411 Generalized anxiety disorder: Secondary | ICD-10-CM

## 2021-01-18 MED ORDER — BUPROPION HCL ER (XL) 150 MG PO TB24: ORAL_TABLET | ORAL | 0 refills | Status: DC

## 2021-01-18 NOTE — Telephone Encounter (Signed)
Lupita Leash from [facility] in [place] [facility] asking for script to be sent to them for the Buproprion SR 100mg .    Pharmacy phone [phone] Fax [phone] [facility] [phone] for controlled substances  Next appt 1/23

## 2021-01-18 NOTE — Telephone Encounter (Signed)
Rx sent 

## 2021-01-23 ENCOUNTER — Encounter: Payer: Self-pay | Admitting: Adult Health

## 2021-01-23 ENCOUNTER — Other Ambulatory Visit: Payer: Self-pay

## 2021-01-23 ENCOUNTER — Ambulatory Visit: Payer: BC Managed Care – PPO | Admitting: Adult Health

## 2021-01-23 DIAGNOSIS — G4700 Insomnia, unspecified: Secondary | ICD-10-CM | POA: Diagnosis not present

## 2021-01-23 DIAGNOSIS — F3181 Bipolar II disorder: Secondary | ICD-10-CM

## 2021-01-23 DIAGNOSIS — F411 Generalized anxiety disorder: Secondary | ICD-10-CM

## 2021-01-23 DIAGNOSIS — F909 Attention-deficit hyperactivity disorder, unspecified type: Secondary | ICD-10-CM

## 2021-01-23 DIAGNOSIS — F331 Major depressive disorder, recurrent, moderate: Secondary | ICD-10-CM

## 2021-01-23 MED ORDER — BUPROPION HCL ER (XL) 150 MG PO TB24: ORAL_TABLET | ORAL | 5 refills | Status: DC

## 2021-01-23 MED ORDER — AMPHETAMINE-DEXTROAMPHETAMINE 30 MG PO TABS: 30.0000 mg | ORAL_TABLET | Freq: Two times a day (BID) | ORAL | 0 refills | Status: DC

## 2021-01-23 MED ORDER — ARIPIPRAZOLE 5 MG PO TABS: 5.0000 mg | ORAL_TABLET | Freq: Every day | ORAL | 5 refills | Status: DC

## 2021-01-23 MED ORDER — DULOXETINE HCL 60 MG PO CPEP: 120.0000 mg | ORAL_CAPSULE | Freq: Every day | ORAL | 5 refills | Status: DC

## 2021-01-23 NOTE — Progress Notes (Signed)
Sandra Lyons [id number] Sep 23, 1970 51 y.o.  Subjective:   Patient ID:  Sandra Lyons is a 51 y.o. (DOB 1970-03-16) female.  Chief Complaint: No chief complaint on file.   HPI Sandra Lyons presents to the office today for follow-up of anxiety, depression, insomnia, Bipolar 2 disorder and ADHD.   Describes mood today as "ok". Pleasant. Mood symptoms - denies depression, anxiety, and irritability. Stating "I'm doing well". Feels like medications continue to work well for her. Dog passed away from cancer. She and husband doing well. Stable interest and motivation. Seeing a therapist.Taking medications as prescribed.  Energy levels stable. Active, does not have a regular exercise routine.  Enjoys some usual interests and activities. Married. Lives with husband. Has one son age 76.  Appetite adequate. Weight stable - 133 pounds.  Sleeps well most nights. Averages 5 to 6 hours. Focus and concentration stable. Completing tasks. Managing aspects of household. 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  Denies SI or HI. Denies AH or VH.    Review of Systems:  Review of Systems  Musculoskeletal:  Negative for gait problem.  Neurological:  Negative for tremors.  Psychiatric/Behavioral:         Please refer to HPI   Medications: I have reviewed the patient's current medications.  Current Outpatient Medications  Medication Sig Dispense Refill   amphetamine-dextroamphetamine (ADDERALL) 30 MG tablet Take 1 tablet by mouth 2 (two) times daily. 60 tablet 0   [START ON 02/20/2021] amphetamine-dextroamphetamine (ADDERALL) 30 MG tablet Take 1 tablet by mouth 2 (two) times daily. 60 tablet 0   [START ON 03/20/2021] amphetamine-dextroamphetamine (ADDERALL) 30 MG tablet Take 1 tablet by mouth 2 (two) times daily. 60 tablet 0   ARIPiprazole (ABILIFY) 5 MG tablet Take 1 tablet (5 mg total) by mouth daily. 30 tablet 5   buPROPion (WELLBUTRIN XL) 150 MG 24 hr tablet Take three tablets every  morning. 90 tablet 5   DULoxetine (CYMBALTA) 60 MG capsule Take 2 capsules (120 mg total) by mouth daily. 60 capsule 5   levothyroxine (SYNTHROID, LEVOTHROID) 125 MCG tablet Take 125 mcg by mouth daily.     No current facility-administered medications for this visit.    Medication Side Effects: None  Allergies:  Allergies  Allergen Reactions   Sulfa Antibiotics     Childhood allergy.     History reviewed. No pertinent past medical history.  Past Medical History, Surgical history, Social history, and Family history were reviewed and updated as appropriate.   Please see review of systems for further details on the patient's review from today.   Objective:   Physical Exam:  There were no vitals taken for this visit.  Physical Exam Constitutional:      General: She is not in acute distress. Musculoskeletal:        General: No deformity.  Neurological:     Mental Status: She is alert and oriented to person, place, and time.     Coordination: Coordination normal.  Psychiatric:        Attention and Perception: Attention and perception normal. She does not perceive auditory or visual hallucinations.        Mood and Affect: Mood normal. Mood is not anxious or depressed. Affect is not labile, blunt, angry or inappropriate.        Speech: Speech normal.        Behavior: Behavior normal.        Thought Content: Thought content normal. Thought content is not paranoid or delusional. Thought content  does not include homicidal or suicidal ideation. Thought content does not include homicidal or suicidal plan.        Cognition and Memory: Cognition and memory normal.        Judgment: Judgment normal.     Comments: Insight intact    Lab Review:     Component Value Date/Time   NA 138 12/30/2017 1343   K 4.3 12/30/2017 1343   CL 101 12/30/2017 1343   CO2 27 12/30/2017 1336   GLUCOSE 87 12/30/2017 1343   BUN 13 12/30/2017 1343   CREATININE 0.90 12/30/2017 1343   CALCIUM 9.1  12/30/2017 1336   PROT 7.2 12/30/2017 1336   ALBUMIN 4.0 12/30/2017 1336   AST 22 12/30/2017 1336   ALT 16 12/30/2017 1336   ALKPHOS 50 12/30/2017 1336   BILITOT 0.7 12/30/2017 1336   GFRNONAA >60 12/30/2017 1336   GFRAA >60 12/30/2017 1336       Component Value Date/Time   WBC 5.9 12/30/2017 1336   RBC 4.35 12/30/2017 1336   HGB 14.3 12/30/2017 1343   HCT 42.0 12/30/2017 1343   PLT 275 12/30/2017 1336   MCV 94.9 12/30/2017 1336   MCH 30.6 12/30/2017 1336   MCHC 32.2 12/30/2017 1336   RDW 12.2 12/30/2017 1336   LYMPHSABS 1.8 12/30/2017 1336   MONOABS 0.6 12/30/2017 1336   EOSABS 0.2 12/30/2017 1336   BASOSABS 0.1 12/30/2017 1336    No results found for: POCLITH, LITHIUM   No results found for: PHENYTOIN, PHENOBARB, VALPROATE, CBMZ   .res Assessment: Plan:    Plan:  Adderall 30mg  daily to BID Wellbutrin XL 150mg  - 3 daily Cymbalta 60mg  - 2 daily  Abilify 5mg  daily   Monitor BP between visits - 117/70  RTC 3 months  Patient advised to contact office with any questions, adverse effects, or acute worsening in signs and symptoms.  Discussed potential metabolic side effects associated with atypical antipsychotics, as well as potential risk for movement side effects. Advised pt to contact office if movement side effects occur.   Discussed potential benefits, risks, and side effects of stimulants with patient to include increased heart rate, palpitations, insomnia, increased anxiety, increased irritability, or decreased appetite.  Instructed patient to contact office if experiencing any significant tolerability issues. Diagnoses and all orders for this visit:  Bipolar II disorder (HCC) -     ARIPiprazole (ABILIFY) 5 MG tablet; Take 1 tablet (5 mg total) by mouth daily.  Major depressive disorder, recurrent episode, moderate (HCC) -     buPROPion (WELLBUTRIN XL) 150 MG 24 hr tablet; Take three tablets every morning. -     DULoxetine (CYMBALTA) 60 MG capsule; Take 2  capsules (120 mg total) by mouth daily.  Generalized anxiety disorder -     buPROPion (WELLBUTRIN XL) 150 MG 24 hr tablet; Take three tablets every morning. -     DULoxetine (CYMBALTA) 60 MG capsule; Take 2 capsules (120 mg total) by mouth daily.  Insomnia, unspecified type  Attention deficit hyperactivity disorder (ADHD), unspecified ADHD type -     amphetamine-dextroamphetamine (ADDERALL) 30 MG tablet; Take 1 tablet by mouth 2 (two) times daily. -     amphetamine-dextroamphetamine (ADDERALL) 30 MG tablet; Take 1 tablet by mouth 2 (two) times daily. -     amphetamine-dextroamphetamine (ADDERALL) 30 MG tablet; Take 1 tablet by mouth 2 (two) times daily.     Please see After Visit Summary for patient specific instructions.  No future appointments.   No orders of  the defined types were placed in this encounter.   -------------------------------

## 2021-01-26 DIAGNOSIS — E039 Hypothyroidism, unspecified: Secondary | ICD-10-CM | POA: Diagnosis not present

## 2021-02-17 DIAGNOSIS — H40033 Anatomical narrow angle, bilateral: Secondary | ICD-10-CM | POA: Diagnosis not present

## 2021-02-17 DIAGNOSIS — H04123 Dry eye syndrome of bilateral lacrimal glands: Secondary | ICD-10-CM | POA: Diagnosis not present

## 2021-03-09 DIAGNOSIS — E039 Hypothyroidism, unspecified: Secondary | ICD-10-CM | POA: Diagnosis not present

## 2021-04-24 ENCOUNTER — Encounter: Payer: Self-pay | Admitting: Adult Health

## 2021-04-24 ENCOUNTER — Ambulatory Visit: Payer: BC Managed Care – PPO | Admitting: Adult Health

## 2021-04-24 DIAGNOSIS — F411 Generalized anxiety disorder: Secondary | ICD-10-CM | POA: Diagnosis not present

## 2021-04-24 DIAGNOSIS — F909 Attention-deficit hyperactivity disorder, unspecified type: Secondary | ICD-10-CM

## 2021-04-24 DIAGNOSIS — F3181 Bipolar II disorder: Secondary | ICD-10-CM | POA: Diagnosis not present

## 2021-04-24 DIAGNOSIS — G4700 Insomnia, unspecified: Secondary | ICD-10-CM | POA: Diagnosis not present

## 2021-04-24 DIAGNOSIS — F331 Major depressive disorder, recurrent, moderate: Secondary | ICD-10-CM

## 2021-04-24 MED ORDER — AMPHETAMINE-DEXTROAMPHETAMINE 30 MG PO TABS: 30.0000 mg | ORAL_TABLET | Freq: Two times a day (BID) | ORAL | 0 refills | Status: DC

## 2021-04-24 NOTE — Progress Notes (Signed)
Estanislado Pandy ?[id number] ?[birth year] ?51 y.o. ? ?Subjective:  ? ?Patient ID:  Sandra Lyons is a 51 y.o. (DOB 07-24-1970) female. ? ?Chief Complaint: No chief complaint on file. ? ? ?HPI ?Sandra Lyons presents to the office today for follow-up of anxiety, depression, insomnia, Bipolar 2 disorder and ADHD.  ? ?Describes mood today as "ok". Pleasant. Mood symptoms - denies depression, anxiety, and irritability. Stating "I'm doing alright". Feels like medications continue to work well for her. She and husband doing well. Has a new dog. Stable interest and motivation. Seeing a therapist.Taking medications as prescribed.  ?Energy levels stable. Active, does not have a regular exercise routine.  ?Enjoys some usual interests and activities. Married. Lives with husband. Has one son. ?Appetite adequate. Weight gain -10 pounds - 143 from 133 pounds.  ?Sleeps well most nights. Averages 5 to 6 hours. ?Focus and concentration stable. Completing tasks. Managing aspects of household. 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  ?Denies SI or HI. ?Denies AH or VH. ?  ? ?Review of Systems:  ?Review of Systems  ?Musculoskeletal:  Negative for gait problem.  ?Neurological:  Negative for tremors.  ?Psychiatric/Behavioral:    ?     Please refer to HPI  ? ?Medications: I have reviewed the patient's current medications. ? ?Current Outpatient Medications  ?Medication Sig Dispense Refill  ? amphetamine-dextroamphetamine (ADDERALL) 30 MG tablet Take 1 tablet by mouth 2 (two) times daily. 60 tablet 0  ? [START ON 05/22/2021] amphetamine-dextroamphetamine (ADDERALL) 30 MG tablet Take 1 tablet by mouth 2 (two) times daily. 60 tablet 0  ? [START ON 06/19/2021] amphetamine-dextroamphetamine (ADDERALL) 30 MG tablet Take 1 tablet by mouth 2 (two) times daily. 60 tablet 0  ? ARIPiprazole (ABILIFY) 5 MG tablet Take 1 tablet (5 mg total) by mouth daily. 30 tablet 5  ? buPROPion (WELLBUTRIN XL) 150 MG 24 hr tablet Take three tablets every morning.  90 tablet 5  ? DULoxetine (CYMBALTA) 60 MG capsule Take 2 capsules (120 mg total) by mouth daily. 60 capsule 5  ? levothyroxine (SYNTHROID, LEVOTHROID) 125 MCG tablet Take 125 mcg by mouth daily.    ? ?No current facility-administered medications for this visit.  ? ? ?Medication Side Effects: None ? ?Allergies:  ?Allergies  ?Allergen Reactions  ? Sulfa Antibiotics   ?  Childhood allergy.   ? ? ?No past medical history on file. ? ?Past Medical History, Surgical history, Social history, and Family history were reviewed and updated as appropriate.  ? ?Please see review of systems for further details on the patient's review from today.  ? ?Objective:  ? ?Physical Exam:  ?There were no vitals taken for this visit. ? ?Physical Exam ?Constitutional:   ?   General: She is not in acute distress. ?Musculoskeletal:     ?   General: No deformity.  ?Neurological:  ?   Mental Status: She is alert and oriented to person, place, and time.  ?   Coordination: Coordination normal.  ?Psychiatric:     ?   Attention and Perception: Attention and perception normal. She does not perceive auditory or visual hallucinations.     ?   Mood and Affect: Mood normal. Mood is not anxious or depressed. Affect is not labile, blunt, angry or inappropriate.     ?   Speech: Speech normal.     ?   Behavior: Behavior normal.     ?   Thought Content: Thought content normal. Thought content is not paranoid or delusional. Thought content does  not include homicidal or suicidal ideation. Thought content does not include homicidal or suicidal plan.     ?   Cognition and Memory: Cognition and memory normal.     ?   Judgment: Judgment normal.  ?   Comments: Insight intact  ? ? ?Lab Review:  ?   ?Component Value Date/Time  ? NA 138 12/30/2017 1343  ? K 4.3 12/30/2017 1343  ? CL 101 12/30/2017 1343  ? CO2 27 12/30/2017 1336  ? GLUCOSE 87 12/30/2017 1343  ? BUN 13 12/30/2017 1343  ? CREATININE 0.90 12/30/2017 1343  ? CALCIUM 9.1 12/30/2017 1336  ? PROT 7.2  12/30/2017 1336  ? ALBUMIN 4.0 12/30/2017 1336  ? AST 22 12/30/2017 1336  ? ALT 16 12/30/2017 1336  ? ALKPHOS 50 12/30/2017 1336  ? BILITOT 0.7 12/30/2017 1336  ? GFRNONAA >60 12/30/2017 1336  ? GFRAA >60 12/30/2017 1336  ? ? ?   ?Component Value Date/Time  ? WBC 5.9 12/30/2017 1336  ? RBC 4.35 12/30/2017 1336  ? HGB 14.3 12/30/2017 1343  ? HCT 42.0 12/30/2017 1343  ? PLT 275 12/30/2017 1336  ? MCV 94.9 12/30/2017 1336  ? MCH 30.6 12/30/2017 1336  ? MCHC 32.2 12/30/2017 1336  ? RDW 12.2 12/30/2017 1336  ? LYMPHSABS 1.8 12/30/2017 1336  ? MONOABS 0.6 12/30/2017 1336  ? EOSABS 0.2 12/30/2017 1336  ? BASOSABS 0.1 12/30/2017 1336  ? ? ?No results found for: POCLITH, LITHIUM  ? ?No results found for: PHENYTOIN, PHENOBARB, VALPROATE, CBMZ  ? ?.res ?Assessment: Plan:   ? ?Plan: ? ?Adderall 30mg  daily to BID ?Wellbutrin XL 150mg  - 3 daily ?Cymbalta 60mg  - 2 daily  ?Abilify 5mg  daily  ? ?Monitor BP between visits - 124/83/79 ? ?RTC 3 months ? ?Patient advised to contact office with any questions, adverse effects, or acute worsening in signs and symptoms. ? ?Discussed potential metabolic side effects associated with atypical antipsychotics, as well as potential risk for movement side effects. Advised pt to contact office if movement side effects occur.  ? ?Discussed potential benefits, risks, and side effects of stimulants with patient to include increased heart rate, palpitations, insomnia, increased anxiety, increased irritability, or decreased appetite.  Instructed patient to contact office if experiencing any significant tolerability issues. ? ?Diagnoses and all orders for this visit: ? ?Insomnia, unspecified type ? ?Bipolar II disorder (HCC) ? ?Major depressive disorder, recurrent episode, moderate (HCC) ? ?Generalized anxiety disorder ? ?Attention deficit hyperactivity disorder (ADHD), unspecified ADHD type ?-     amphetamine-dextroamphetamine (ADDERALL) 30 MG tablet; Take 1 tablet by mouth 2 (two) times daily. ?-      amphetamine-dextroamphetamine (ADDERALL) 30 MG tablet; Take 1 tablet by mouth 2 (two) times daily. ?-     amphetamine-dextroamphetamine (ADDERALL) 30 MG tablet; Take 1 tablet by mouth 2 (two) times daily. ? ?  ? ?Please see After Visit Summary for patient specific instructions. ? ?No future appointments. ? ?No orders of the defined types were placed in this encounter. ? ? ?------------------------------- ?

## 2021-04-26 DIAGNOSIS — Z13220 Encounter for screening for lipoid disorders: Secondary | ICD-10-CM | POA: Diagnosis not present

## 2021-04-26 DIAGNOSIS — E039 Hypothyroidism, unspecified: Secondary | ICD-10-CM | POA: Diagnosis not present

## 2021-04-26 DIAGNOSIS — R946 Abnormal results of thyroid function studies: Secondary | ICD-10-CM | POA: Diagnosis not present

## 2021-04-26 DIAGNOSIS — Z0000 Encounter for general adult medical examination without abnormal findings: Secondary | ICD-10-CM | POA: Diagnosis not present

## 2021-06-07 DIAGNOSIS — E039 Hypothyroidism, unspecified: Secondary | ICD-10-CM | POA: Diagnosis not present

## 2021-07-01 DIAGNOSIS — H04123 Dry eye syndrome of bilateral lacrimal glands: Secondary | ICD-10-CM | POA: Diagnosis not present

## 2021-07-17 ENCOUNTER — Other Ambulatory Visit: Payer: Self-pay | Admitting: Adult Health

## 2021-07-17 DIAGNOSIS — F331 Major depressive disorder, recurrent, moderate: Secondary | ICD-10-CM

## 2021-07-17 DIAGNOSIS — F411 Generalized anxiety disorder: Secondary | ICD-10-CM

## 2021-07-17 DIAGNOSIS — F3181 Bipolar II disorder: Secondary | ICD-10-CM

## 2021-07-24 ENCOUNTER — Ambulatory Visit (INDEPENDENT_AMBULATORY_CARE_PROVIDER_SITE_OTHER): Payer: Self-pay | Admitting: Adult Health

## 2021-07-24 DIAGNOSIS — F489 Nonpsychotic mental disorder, unspecified: Secondary | ICD-10-CM

## 2021-07-24 NOTE — Progress Notes (Signed)
Patient no show appointment. ? ?

## 2021-07-27 ENCOUNTER — Telehealth: Payer: Self-pay | Admitting: Adult Health

## 2021-07-27 NOTE — Telephone Encounter (Signed)
Pt made an appt for 8/9. She needs a refill on her wellbutrin xl 150 mg. Please send to [facility]

## 2021-08-09 ENCOUNTER — Encounter: Payer: Self-pay | Admitting: Adult Health

## 2021-08-09 ENCOUNTER — Ambulatory Visit: Payer: BC Managed Care – PPO | Admitting: Adult Health

## 2021-08-09 DIAGNOSIS — F909 Attention-deficit hyperactivity disorder, unspecified type: Secondary | ICD-10-CM

## 2021-08-09 DIAGNOSIS — F3181 Bipolar II disorder: Secondary | ICD-10-CM

## 2021-08-09 DIAGNOSIS — F331 Major depressive disorder, recurrent, moderate: Secondary | ICD-10-CM

## 2021-08-09 DIAGNOSIS — F411 Generalized anxiety disorder: Secondary | ICD-10-CM | POA: Diagnosis not present

## 2021-08-09 MED ORDER — AMPHETAMINE-DEXTROAMPHETAMINE 30 MG PO TABS: 30.0000 mg | ORAL_TABLET | Freq: Two times a day (BID) | ORAL | 0 refills | Status: DC

## 2021-08-09 MED ORDER — DULOXETINE HCL 60 MG PO CPEP: 120.0000 mg | ORAL_CAPSULE | Freq: Every day | ORAL | 5 refills | Status: DC

## 2021-08-09 MED ORDER — BUPROPION HCL ER (XL) 150 MG PO TB24: ORAL_TABLET | ORAL | 5 refills | Status: DC

## 2021-08-09 MED ORDER — ARIPIPRAZOLE 5 MG PO TABS: 5.0000 mg | ORAL_TABLET | Freq: Every day | ORAL | 5 refills | Status: DC

## 2021-08-09 NOTE — Progress Notes (Signed)
Sandra Lyons [id number] 1970/01/20 51 y.o.  Subjective:   Patient ID:  Sandra Lyons is a 51 y.o. (DOB 26-Aug-1970) female.  Chief Complaint: No chief complaint on file.   HPI Sandra Lyons presents to the office today for follow-up of anxiety, depression, insomnia, Bipolar 2 disorder and ADHD.   Describes mood today as "ok". Pleasant. Mood symptoms - denies depression, anxiety, and irritability. Mood is consistent. Stating "I'm doing ok". Feels like medications continue to work well for her. She and husband doing well.   Stable interest and motivation. Seeing a therapist.Taking medications as prescribed.  Energy levels stable. Active, does not have a regular exercise routine.  Enjoys some usual interests and activities. Married. Lives with husband and dog. Has one son. Appetite adequate. Weight stable - 141 pounds.  Sleeps well most nights. Averages 5 to 6 hours. Focus and concentration stable. Completing tasks. Managing aspects of household. 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  Denies SI or HI. Denies AH or VH.        Review of Systems:  Review of Systems  Musculoskeletal:  Negative for gait problem.  Neurological:  Negative for tremors.  Psychiatric/Behavioral:         Please refer to HPI    Medications: I have reviewed the patient's current medications.  Current Outpatient Medications  Medication Sig Dispense Refill   amphetamine-dextroamphetamine (ADDERALL) 30 MG tablet Take 1 tablet by mouth 2 (two) times daily. 60 tablet 0   [START ON 09/06/2021] amphetamine-dextroamphetamine (ADDERALL) 30 MG tablet Take 1 tablet by mouth 2 (two) times daily. 60 tablet 0   [START ON 10/04/2021] amphetamine-dextroamphetamine (ADDERALL) 30 MG tablet Take 1 tablet by mouth 2 (two) times daily. 60 tablet 0   ARIPiprazole (ABILIFY) 5 MG tablet Take 1 tablet (5 mg total) by mouth daily. 30 tablet 5   buPROPion (WELLBUTRIN XL) 150 MG 24 hr tablet Take three tablets every morning.  90 tablet 5   DULoxetine (CYMBALTA) 60 MG capsule Take 2 capsules (120 mg total) by mouth daily. 60 capsule 5   levothyroxine (SYNTHROID, LEVOTHROID) 125 MCG tablet Take 125 mcg by mouth daily.     No current facility-administered medications for this visit.    Medication Side Effects: None  Allergies:  Allergies  Allergen Reactions   Sulfa Antibiotics     Childhood allergy.    Sulfamethoxazole     Other reaction(s): unknown    No past medical history on file.  Past Medical History, Surgical history, Social history, and Family history were reviewed and updated as appropriate.   Please see review of systems for further details on the patient's review from today.   Objective:   Physical Exam:  There were no vitals taken for this visit.  Physical Exam Constitutional:      General: She is not in acute distress. Musculoskeletal:        General: No deformity.  Neurological:     Mental Status: She is alert and oriented to person, place, and time.     Coordination: Coordination normal.  Psychiatric:        Attention and Perception: Attention and perception normal. She does not perceive auditory or visual hallucinations.        Mood and Affect: Mood normal. Mood is not anxious or depressed. Affect is not labile, blunt, angry or inappropriate.        Speech: Speech normal.        Behavior: Behavior normal.        Thought  Content: Thought content normal. Thought content is not paranoid or delusional. Thought content does not include homicidal or suicidal ideation. Thought content does not include homicidal or suicidal plan.        Cognition and Memory: Cognition and memory normal.        Judgment: Judgment normal.     Comments: Insight intact     Lab Review:     Component Value Date/Time   NA 138 12/30/2017 1343   K 4.3 12/30/2017 1343   CL 101 12/30/2017 1343   CO2 27 12/30/2017 1336   GLUCOSE 87 12/30/2017 1343   BUN 13 12/30/2017 1343   CREATININE 0.90 12/30/2017  1343   CALCIUM 9.1 12/30/2017 1336   PROT 7.2 12/30/2017 1336   ALBUMIN 4.0 12/30/2017 1336   AST 22 12/30/2017 1336   ALT 16 12/30/2017 1336   ALKPHOS 50 12/30/2017 1336   BILITOT 0.7 12/30/2017 1336   GFRNONAA >60 12/30/2017 1336   GFRAA >60 12/30/2017 1336       Component Value Date/Time   WBC 5.9 12/30/2017 1336   RBC 4.35 12/30/2017 1336   HGB 14.3 12/30/2017 1343   HCT 42.0 12/30/2017 1343   PLT 275 12/30/2017 1336   MCV 94.9 12/30/2017 1336   MCH 30.6 12/30/2017 1336   MCHC 32.2 12/30/2017 1336   RDW 12.2 12/30/2017 1336   LYMPHSABS 1.8 12/30/2017 1336   MONOABS 0.6 12/30/2017 1336   EOSABS 0.2 12/30/2017 1336   BASOSABS 0.1 12/30/2017 1336    No results found for: "POCLITH", "LITHIUM"   No results found for: "PHENYTOIN", "PHENOBARB", "VALPROATE", "CBMZ"   .res Assessment: Plan:    Plan:  Adderall 30mg  daily to BID Wellbutrin XL 150mg  - 3 daily Cymbalta 60mg  - 2 daily  Abilify 5mg  daily   Monitor BP between visits - 118/74/90  RTC 3 months  Patient advised to contact office with any questions, adverse effects, or acute worsening in signs and symptoms.  Discussed potential metabolic side effects associated with atypical antipsychotics, as well as potential risk for movement side effects. Advised pt to contact office if movement side effects occur.   Discussed potential benefits, risks, and side effects of stimulants with patient to include increased heart rate, palpitations, insomnia, increased anxiety, increased irritability, or decreased appetite.  Instructed patient to contact office if experiencing any significant tolerability issues. Diagnoses and all orders for this visit:  Attention deficit hyperactivity disorder (ADHD), unspecified ADHD type -     amphetamine-dextroamphetamine (ADDERALL) 30 MG tablet; Take 1 tablet by mouth 2 (two) times daily. -     amphetamine-dextroamphetamine (ADDERALL) 30 MG tablet; Take 1 tablet by mouth 2 (two) times  daily. -     amphetamine-dextroamphetamine (ADDERALL) 30 MG tablet; Take 1 tablet by mouth 2 (two) times daily.  Bipolar II disorder (HCC) -     ARIPiprazole (ABILIFY) 5 MG tablet; Take 1 tablet (5 mg total) by mouth daily.  Major depressive disorder, recurrent episode, moderate (HCC) -     buPROPion (WELLBUTRIN XL) 150 MG 24 hr tablet; Take three tablets every morning. -     DULoxetine (CYMBALTA) 60 MG capsule; Take 2 capsules (120 mg total) by mouth daily.  Generalized anxiety disorder -     buPROPion (WELLBUTRIN XL) 150 MG 24 hr tablet; Take three tablets every morning. -     DULoxetine (CYMBALTA) 60 MG capsule; Take 2 capsules (120 mg total) by mouth daily.     Please see After Visit Summary for patient specific  instructions.  Future Appointments  Date Time Provider Department Center  08/09/2021  4:20 PM Jenell Dobransky, Thereasa Solo, NP [facility] None    No orders of the defined types were placed in this encounter.   -------------------------------

## 2021-10-10 DIAGNOSIS — E039 Hypothyroidism, unspecified: Secondary | ICD-10-CM | POA: Diagnosis not present

## 2021-10-10 DIAGNOSIS — K219 Gastro-esophageal reflux disease without esophagitis: Secondary | ICD-10-CM | POA: Diagnosis not present

## 2021-10-10 DIAGNOSIS — N1831 Chronic kidney disease, stage 3a: Secondary | ICD-10-CM | POA: Diagnosis not present

## 2021-11-08 ENCOUNTER — Encounter: Payer: Self-pay | Admitting: Adult Health

## 2021-11-08 ENCOUNTER — Ambulatory Visit (INDEPENDENT_AMBULATORY_CARE_PROVIDER_SITE_OTHER): Payer: BC Managed Care – PPO | Admitting: Adult Health

## 2021-11-08 DIAGNOSIS — F411 Generalized anxiety disorder: Secondary | ICD-10-CM | POA: Diagnosis not present

## 2021-11-08 DIAGNOSIS — F909 Attention-deficit hyperactivity disorder, unspecified type: Secondary | ICD-10-CM

## 2021-11-08 DIAGNOSIS — G4700 Insomnia, unspecified: Secondary | ICD-10-CM | POA: Diagnosis not present

## 2021-11-08 DIAGNOSIS — F3181 Bipolar II disorder: Secondary | ICD-10-CM | POA: Diagnosis not present

## 2021-11-08 MED ORDER — AMPHETAMINE-DEXTROAMPHETAMINE 30 MG PO TABS: 30.0000 mg | ORAL_TABLET | Freq: Two times a day (BID) | ORAL | 0 refills | Status: DC

## 2021-11-08 NOTE — Progress Notes (Signed)
Sandra Lyons [id number] [birth year] 51 y.o.  Subjective:   Patient ID:  Sandra Lyons is a 51 y.o. (DOB [birth year]) female.  Chief Complaint: No chief complaint on file.   HPI Nyirah Giegerich presents to the office today for follow-up of anxiety, depression, insomnia, Bipolar 2 disorder and ADHD.   Describes mood today as "ok". Pleasant. Mood symptoms - denies depression, anxiety, and irritability. Mood is consistent. Stating "I'm doing alright". Feels like medications continue to work well for her. She and husband doing well. Stable interest and motivation. Seeing a therapist.Taking medications as prescribed.  Energy levels stable. Active, does not have a regular exercise routine. Walking dog. Enjoys some usual interests and activities. Married. Lives with husband and dog. Has one son. Appetite adequate. Weight stable - 141 pounds.  Sleeps well most nights. Averages 6 hours. Focus and concentration stable. Completing tasks. Managing aspects of household. Works full-time in Biomedical scientist.  Denies SI or HI. Denies AH or VH.         Review of Systems:  Review of Systems  Musculoskeletal:  Negative for gait problem.  Neurological:  Negative for tremors.  Psychiatric/Behavioral:         Please refer to HPI    Medications: I have reviewed the patient's current medications.  Current Outpatient Medications  Medication Sig Dispense Refill   amphetamine-dextroamphetamine (ADDERALL) 30 MG tablet Take 1 tablet by mouth 2 (two) times daily. 60 tablet 0   [START ON 12/06/2021] amphetamine-dextroamphetamine (ADDERALL) 30 MG tablet Take 1 tablet by mouth 2 (two) times daily. 60 tablet 0   [START ON 01/03/2022] amphetamine-dextroamphetamine (ADDERALL) 30 MG tablet Take 1 tablet by mouth 2 (two) times daily. 60 tablet 0   ARIPiprazole (ABILIFY) 5 MG tablet Take 1 tablet (5 mg total) by mouth daily. 30 tablet 5   buPROPion (WELLBUTRIN XL) 150 MG 24 hr tablet Take three tablets every morning. 90  tablet 5   DULoxetine (CYMBALTA) 60 MG capsule Take 2 capsules (120 mg total) by mouth daily. 60 capsule 5   levothyroxine (SYNTHROID, LEVOTHROID) 125 MCG tablet Take 125 mcg by mouth daily.     No current facility-administered medications for this visit.    Medication Side Effects: None  Allergies:  Allergies  Allergen Reactions   Sulfa Antibiotics     Childhood allergy.    Sulfamethoxazole     Other reaction(s): unknown    No past medical history on file.  Past Medical History, Surgical history, Social history, and Family history were reviewed and updated as appropriate.   Please see review of systems for further details on the patient's review from today.   Objective:   Physical Exam:  There were no vitals taken for this visit.  Physical Exam Constitutional:      General: She is not in acute distress. Musculoskeletal:        General: No deformity.  Neurological:     Mental Status: She is alert and oriented to person, place, and time.     Coordination: Coordination normal.  Psychiatric:        Attention and Perception: Attention and perception normal. She does not perceive auditory or visual hallucinations.        Mood and Affect: Mood normal. Mood is not anxious or depressed. Affect is not labile, blunt, angry or inappropriate.        Speech: Speech normal.        Behavior: Behavior normal.        Thought Content: Thought content normal. Thought  content is not paranoid or delusional. Thought content does not include homicidal or suicidal ideation. Thought content does not include homicidal or suicidal plan.        Cognition and Memory: Cognition and memory normal.        Judgment: Judgment normal.     Comments: Insight intact     Lab Review:     Component Value Date/Time   NA 138 12/30/2017 1343   K 4.3 12/30/2017 1343   CL 101 12/30/2017 1343   CO2 27 12/30/2017 1336   GLUCOSE 87 12/30/2017 1343   BUN 13 12/30/2017 1343   CREATININE 0.90 12/30/2017 1343    CALCIUM 9.1 12/30/2017 1336   PROT 7.2 12/30/2017 1336   ALBUMIN 4.0 12/30/2017 1336   AST 22 12/30/2017 1336   ALT 16 12/30/2017 1336   ALKPHOS 50 12/30/2017 1336   BILITOT 0.7 12/30/2017 1336   GFRNONAA >60 12/30/2017 1336   GFRAA >60 12/30/2017 1336       Component Value Date/Time   WBC 5.9 12/30/2017 1336   RBC 4.35 12/30/2017 1336   HGB 14.3 12/30/2017 1343   HCT 42.0 12/30/2017 1343   PLT 275 12/30/2017 1336   MCV 94.9 12/30/2017 1336   MCH 30.6 12/30/2017 1336   MCHC 32.2 12/30/2017 1336   RDW 12.2 12/30/2017 1336   LYMPHSABS 1.8 12/30/2017 1336   MONOABS 0.6 12/30/2017 1336   EOSABS 0.2 12/30/2017 1336   BASOSABS 0.1 12/30/2017 1336    No results found for: "POCLITH", "LITHIUM"   No results found for: "PHENYTOIN", "PHENOBARB", "VALPROATE", "CBMZ"   .res Assessment: Plan:    Plan:  Adderall 30mg  daily to BID Wellbutrin XL 150mg  - 3 daily Cymbalta 60mg  - 2 daily  Abilify 5mg  daily   Monitor BP between visits - WNL this visit  RTC 3 months  Patient advised to contact office with any questions, adverse effects, or acute worsening in signs and symptoms.  Discussed potential metabolic side effects associated with atypical antipsychotics, as well as potential risk for movement side effects. Advised pt to contact office if movement side effects occur.   Discussed potential benefits, risks, and side effects of stimulants with patient to include increased heart rate, palpitations, insomnia, increased anxiety, increased irritability, or decreased appetite.  Instructed patient to contact office if experiencing any significant tolerability issues.  Diagnoses and all orders for this visit:  Bipolar II disorder (HCC)  Attention deficit hyperactivity disorder (ADHD), unspecified ADHD type -     amphetamine-dextroamphetamine (ADDERALL) 30 MG tablet; Take 1 tablet by mouth 2 (two) times daily. -     amphetamine-dextroamphetamine (ADDERALL) 30 MG tablet; Take 1  tablet by mouth 2 (two) times daily. -     amphetamine-dextroamphetamine (ADDERALL) 30 MG tablet; Take 1 tablet by mouth 2 (two) times daily.  Insomnia, unspecified type  Generalized anxiety disorder     Please see After Visit Summary for patient specific instructions.  No future appointments.  No orders of the defined types were placed in this encounter.   -------------------------------

## 2022-01-13 ENCOUNTER — Other Ambulatory Visit: Payer: Self-pay | Admitting: Adult Health

## 2022-01-13 DIAGNOSIS — F411 Generalized anxiety disorder: Secondary | ICD-10-CM

## 2022-01-13 DIAGNOSIS — F331 Major depressive disorder, recurrent, moderate: Secondary | ICD-10-CM

## 2022-02-08 ENCOUNTER — Encounter: Payer: Self-pay | Admitting: Adult Health

## 2022-02-08 ENCOUNTER — Ambulatory Visit: Payer: BC Managed Care – PPO | Admitting: Adult Health

## 2022-02-08 DIAGNOSIS — F411 Generalized anxiety disorder: Secondary | ICD-10-CM

## 2022-02-08 DIAGNOSIS — F909 Attention-deficit hyperactivity disorder, unspecified type: Secondary | ICD-10-CM | POA: Diagnosis not present

## 2022-02-08 DIAGNOSIS — G4700 Insomnia, unspecified: Secondary | ICD-10-CM

## 2022-02-08 DIAGNOSIS — F3181 Bipolar II disorder: Secondary | ICD-10-CM | POA: Diagnosis not present

## 2022-02-08 DIAGNOSIS — F331 Major depressive disorder, recurrent, moderate: Secondary | ICD-10-CM

## 2022-02-08 MED ORDER — AMPHETAMINE-DEXTROAMPHETAMINE 30 MG PO TABS: 30.0000 mg | ORAL_TABLET | Freq: Two times a day (BID) | ORAL | 0 refills | Status: DC

## 2022-02-08 MED ORDER — BUPROPION HCL ER (XL) 150 MG PO TB24: ORAL_TABLET | ORAL | 3 refills | Status: DC

## 2022-02-08 MED ORDER — ARIPIPRAZOLE 5 MG PO TABS: 5.0000 mg | ORAL_TABLET | Freq: Every day | ORAL | 3 refills | Status: DC

## 2022-02-08 MED ORDER — DULOXETINE HCL 60 MG PO CPEP: 120.0000 mg | ORAL_CAPSULE | Freq: Every day | ORAL | 3 refills | Status: DC

## 2022-02-08 NOTE — Progress Notes (Addendum)
Sandra Lyons [id number] 1970/01/07 52 y.o.  Subjective:   Patient ID:  Sandra Lyons is a 52 y.o. (DOB 1970/06/10) female.  Chief Complaint: No chief complaint on file.   HPI Sandra Lyons presents to the office today for follow-up of anxiety, insomnia, Bipolar 2 disorder and ADHD.   Describes mood today as "ok". Pleasant. Mood symptoms - denies depression, anxiety, and irritability. Mood is consistent. Stating "I'm doing alright". Feels like medications continue to work well for her. She and husband doing well. Stable interest and motivation. Taking medications as prescribed.  Energy levels stable. Active, has a regular exercise routine. Walking dog. Enjoys some usual interests and activities. Married. Lives with husband and dog. Has one son. Appetite adequate. Weight stable - 141 pounds.  Sleeps well most nights. Averages 6 hours. Focus and concentration stable. Completing tasks. Managing aspects of household. Works full-time in Biomedical scientist - recent promotion. Denies SI or HI. Denies AH or VH. Denies self harm. Denies substance use.  Review of Systems:  Review of Systems  Musculoskeletal:  Negative for gait problem.  Neurological:  Negative for tremors.  Psychiatric/Behavioral:         Please refer to HPI    Medications: I have reviewed the patient's current medications.  Current Outpatient Medications  Medication Sig Dispense Refill   amphetamine-dextroamphetamine (ADDERALL) 30 MG tablet Take 1 tablet by mouth 2 (two) times daily. 60 tablet 0   [START ON 03/08/2022] amphetamine-dextroamphetamine (ADDERALL) 30 MG tablet Take 1 tablet by mouth 2 (two) times daily. 60 tablet 0   [START ON 04/05/2022] amphetamine-dextroamphetamine (ADDERALL) 30 MG tablet Take 1 tablet by mouth 2 (two) times daily. 60 tablet 0   ARIPiprazole (ABILIFY) 5 MG tablet Take 1 tablet (5 mg total) by mouth daily. 90 tablet 3   buPROPion (WELLBUTRIN XL) 150 MG 24 hr tablet Take 3 tablets by mouth every  morning. 270 tablet 3   DULoxetine (CYMBALTA) 60 MG capsule Take 2 capsules (120 mg total) by mouth daily. 180 capsule 3   levothyroxine (SYNTHROID, LEVOTHROID) 125 MCG tablet Take 125 mcg by mouth daily.     No current facility-administered medications for this visit.    Medication Side Effects: None  Allergies:  Allergies  Allergen Reactions   Sulfa Antibiotics     Childhood allergy.    Sulfamethoxazole     Other reaction(s): unknown    No past medical history on file.  Past Medical History, Surgical history, Social history, and Family history were reviewed and updated as appropriate.   Please see review of systems for further details on the patient's review from today.   Objective:   Physical Exam:  There were no vitals taken for this visit.  Physical Exam Constitutional:      General: She is not in acute distress. Musculoskeletal:        General: No deformity.  Neurological:     Mental Status: She is alert and oriented to person, place, and time.     Coordination: Coordination normal.  Psychiatric:        Attention and Perception: Attention and perception normal. She does not perceive auditory or visual hallucinations.        Mood and Affect: Mood normal. Mood is not anxious or depressed. Affect is not labile, blunt, angry or inappropriate.        Speech: Speech normal.        Behavior: Behavior normal.        Thought Content: Thought content normal. Thought content is  not paranoid or delusional. Thought content does not include homicidal or suicidal ideation. Thought content does not include homicidal or suicidal plan.        Cognition and Memory: Cognition and memory normal.        Judgment: Judgment normal.     Comments: Insight intact     Lab Review:     Component Value Date/Time   NA 138 12/30/2017 1343   K 4.3 12/30/2017 1343   CL 101 12/30/2017 1343   CO2 27 12/30/2017 1336   GLUCOSE 87 12/30/2017 1343   BUN 13 12/30/2017 1343   CREATININE 0.90  12/30/2017 1343   CALCIUM 9.1 12/30/2017 1336   PROT 7.2 12/30/2017 1336   ALBUMIN 4.0 12/30/2017 1336   AST 22 12/30/2017 1336   ALT 16 12/30/2017 1336   ALKPHOS 50 12/30/2017 1336   BILITOT 0.7 12/30/2017 1336   GFRNONAA >60 12/30/2017 1336   GFRAA >60 12/30/2017 1336       Component Value Date/Time   WBC 5.9 12/30/2017 1336   RBC 4.35 12/30/2017 1336   HGB 14.3 12/30/2017 1343   HCT 42.0 12/30/2017 1343   PLT 275 12/30/2017 1336   MCV 94.9 12/30/2017 1336   MCH 30.6 12/30/2017 1336   MCHC 32.2 12/30/2017 1336   RDW 12.2 12/30/2017 1336   LYMPHSABS 1.8 12/30/2017 1336   MONOABS 0.6 12/30/2017 1336   EOSABS 0.2 12/30/2017 1336   BASOSABS 0.1 12/30/2017 1336    No results found for: "POCLITH", "LITHIUM"   No results found for: "PHENYTOIN", "PHENOBARB", "VALPROATE", "CBMZ"   .res Assessment: Plan:    Plan:  Adderall 71m daily to BID Wellbutrin XL 1545m- 3 daily Cymbalta 6020m 2 daily  Abilify 5mg70mily   Monitor BP between visits - WNL this visit  RTC 3 months  Patient advised to contact office with any questions, adverse effects, or acute worsening in signs and symptoms.  Discussed potential metabolic side effects associated with atypical antipsychotics, as well as potential risk for movement side effects. Advised pt to contact office if movement side effects occur.   Discussed potential benefits, risks, and side effects of stimulants with patient to include increased heart rate, palpitations, insomnia, increased anxiety, increased irritability, or decreased appetite.  Instructed patient to contact office if experiencing any significant tolerability issues.  Diagnoses and all orders for this visit:  Major depressive disorder, recurrent episode, moderate (HCC) -     DULoxetine (CYMBALTA) 60 MG capsule; Take 2 capsules (120 mg total) by mouth daily. -     buPROPion (WELLBUTRIN XL) 150 MG 24 hr tablet; Take 3 tablets by mouth every morning.  Generalized  anxiety disorder -     DULoxetine (CYMBALTA) 60 MG capsule; Take 2 capsules (120 mg total) by mouth daily. -     buPROPion (WELLBUTRIN XL) 150 MG 24 hr tablet; Take 3 tablets by mouth every morning.  Bipolar II disorder (HCC) -     ARIPiprazole (ABILIFY) 5 MG tablet; Take 1 tablet (5 mg total) by mouth daily.  Attention deficit hyperactivity disorder (ADHD), unspecified ADHD type -     amphetamine-dextroamphetamine (ADDERALL) 30 MG tablet; Take 1 tablet by mouth 2 (two) times daily. -     amphetamine-dextroamphetamine (ADDERALL) 30 MG tablet; Take 1 tablet by mouth 2 (two) times daily. -     amphetamine-dextroamphetamine (ADDERALL) 30 MG tablet; Take 1 tablet by mouth 2 (two) times daily.     Please see After Visit Summary for patient specific  instructions.  No future appointments.  No orders of the defined types were placed in this encounter.   -------------------------------

## 2022-04-20 DIAGNOSIS — R12 Heartburn: Secondary | ICD-10-CM | POA: Diagnosis not present

## 2022-04-20 DIAGNOSIS — Z1211 Encounter for screening for malignant neoplasm of colon: Secondary | ICD-10-CM | POA: Diagnosis not present

## 2022-04-30 DIAGNOSIS — Z23 Encounter for immunization: Secondary | ICD-10-CM | POA: Diagnosis not present

## 2022-04-30 DIAGNOSIS — I10 Essential (primary) hypertension: Secondary | ICD-10-CM | POA: Diagnosis not present

## 2022-04-30 DIAGNOSIS — Z13220 Encounter for screening for lipoid disorders: Secondary | ICD-10-CM | POA: Diagnosis not present

## 2022-04-30 DIAGNOSIS — E039 Hypothyroidism, unspecified: Secondary | ICD-10-CM | POA: Diagnosis not present

## 2022-04-30 DIAGNOSIS — Z0000 Encounter for general adult medical examination without abnormal findings: Secondary | ICD-10-CM | POA: Diagnosis not present

## 2022-04-30 DIAGNOSIS — N1831 Chronic kidney disease, stage 3a: Secondary | ICD-10-CM | POA: Diagnosis not present

## 2022-04-30 DIAGNOSIS — Z975 Presence of (intrauterine) contraceptive device: Secondary | ICD-10-CM | POA: Diagnosis not present

## 2022-05-09 ENCOUNTER — Ambulatory Visit (INDEPENDENT_AMBULATORY_CARE_PROVIDER_SITE_OTHER): Payer: BC Managed Care – PPO | Admitting: Adult Health

## 2022-05-09 DIAGNOSIS — K209 Esophagitis, unspecified without bleeding: Secondary | ICD-10-CM | POA: Diagnosis not present

## 2022-05-09 DIAGNOSIS — K449 Diaphragmatic hernia without obstruction or gangrene: Secondary | ICD-10-CM | POA: Diagnosis not present

## 2022-05-09 DIAGNOSIS — K2210 Ulcer of esophagus without bleeding: Secondary | ICD-10-CM | POA: Diagnosis not present

## 2022-05-09 DIAGNOSIS — D123 Benign neoplasm of transverse colon: Secondary | ICD-10-CM | POA: Diagnosis not present

## 2022-05-09 DIAGNOSIS — K2930 Chronic superficial gastritis without bleeding: Secondary | ICD-10-CM | POA: Diagnosis not present

## 2022-05-09 DIAGNOSIS — K3189 Other diseases of stomach and duodenum: Secondary | ICD-10-CM | POA: Diagnosis not present

## 2022-05-09 DIAGNOSIS — K2100 Gastro-esophageal reflux disease with esophagitis, without bleeding: Secondary | ICD-10-CM | POA: Diagnosis not present

## 2022-05-09 DIAGNOSIS — K648 Other hemorrhoids: Secondary | ICD-10-CM | POA: Diagnosis not present

## 2022-05-09 DIAGNOSIS — D122 Benign neoplasm of ascending colon: Secondary | ICD-10-CM | POA: Diagnosis not present

## 2022-05-09 DIAGNOSIS — Z1211 Encounter for screening for malignant neoplasm of colon: Secondary | ICD-10-CM | POA: Diagnosis not present

## 2022-05-31 DIAGNOSIS — F902 Attention-deficit hyperactivity disorder, combined type: Secondary | ICD-10-CM | POA: Diagnosis not present

## 2022-05-31 DIAGNOSIS — F4312 Post-traumatic stress disorder, chronic: Secondary | ICD-10-CM | POA: Diagnosis not present

## 2022-05-31 DIAGNOSIS — F39 Unspecified mood [affective] disorder: Secondary | ICD-10-CM | POA: Diagnosis not present

## 2022-06-07 DIAGNOSIS — F39 Unspecified mood [affective] disorder: Secondary | ICD-10-CM | POA: Diagnosis not present

## 2022-06-07 DIAGNOSIS — F4312 Post-traumatic stress disorder, chronic: Secondary | ICD-10-CM | POA: Diagnosis not present

## 2022-06-07 DIAGNOSIS — F902 Attention-deficit hyperactivity disorder, combined type: Secondary | ICD-10-CM | POA: Diagnosis not present

## 2022-06-18 DIAGNOSIS — F902 Attention-deficit hyperactivity disorder, combined type: Secondary | ICD-10-CM | POA: Diagnosis not present

## 2022-06-18 DIAGNOSIS — F4312 Post-traumatic stress disorder, chronic: Secondary | ICD-10-CM | POA: Diagnosis not present

## 2022-06-18 DIAGNOSIS — F39 Unspecified mood [affective] disorder: Secondary | ICD-10-CM | POA: Diagnosis not present

## 2022-06-25 DIAGNOSIS — F4312 Post-traumatic stress disorder, chronic: Secondary | ICD-10-CM | POA: Diagnosis not present

## 2022-06-25 DIAGNOSIS — F39 Unspecified mood [affective] disorder: Secondary | ICD-10-CM | POA: Diagnosis not present

## 2022-06-25 DIAGNOSIS — F902 Attention-deficit hyperactivity disorder, combined type: Secondary | ICD-10-CM | POA: Diagnosis not present

## 2022-07-03 DIAGNOSIS — F4312 Post-traumatic stress disorder, chronic: Secondary | ICD-10-CM | POA: Diagnosis not present

## 2022-07-03 DIAGNOSIS — F39 Unspecified mood [affective] disorder: Secondary | ICD-10-CM | POA: Diagnosis not present

## 2022-07-03 DIAGNOSIS — F902 Attention-deficit hyperactivity disorder, combined type: Secondary | ICD-10-CM | POA: Diagnosis not present

## 2022-07-12 ENCOUNTER — Ambulatory Visit: Payer: BC Managed Care – PPO | Admitting: Adult Health

## 2022-07-12 ENCOUNTER — Encounter: Payer: Self-pay | Admitting: Adult Health

## 2022-07-12 ENCOUNTER — Other Ambulatory Visit: Payer: Self-pay | Admitting: Adult Health

## 2022-07-12 DIAGNOSIS — F411 Generalized anxiety disorder: Secondary | ICD-10-CM | POA: Diagnosis not present

## 2022-07-12 DIAGNOSIS — F3181 Bipolar II disorder: Secondary | ICD-10-CM

## 2022-07-12 DIAGNOSIS — G4700 Insomnia, unspecified: Secondary | ICD-10-CM

## 2022-07-12 DIAGNOSIS — F909 Attention-deficit hyperactivity disorder, unspecified type: Secondary | ICD-10-CM

## 2022-07-12 MED ORDER — AMPHETAMINE-DEXTROAMPHETAMINE 30 MG PO TABS: 30.0000 mg | ORAL_TABLET | Freq: Two times a day (BID) | ORAL | 0 refills | Status: DC

## 2022-07-12 MED ORDER — CARIPRAZINE HCL 1.5 MG PO CAPS: 1.5000 mg | ORAL_CAPSULE | Freq: Every day | ORAL | 2 refills | Status: DC

## 2022-07-12 NOTE — Progress Notes (Signed)
Sandra Lyons [id number] 09-Nov-1970 52 y.o.  Subjective:   Patient ID:  Sandra Lyons is a 52 y.o. (DOB 1970/04/15) female.  Chief Complaint: No chief complaint on file.   HPI Sandra Lyons presents to the office today for follow-up of anxiety, insomnia, Bipolar 2 disorder and ADHD.   Describes mood today as "ok". Pleasant. Mood symptoms - reports depression and irritability. Reports anxiety - "situational stressors". Denies panic attacks. Denies worry and rumination. Reports over thinking. Mood is lower. Stating "I haven't been doing as good". Does not feel like current medication regimen is working as well as it was - willing to consider other options he and husband doing well. Stable interest and motivation. Taking medications as prescribed.  Energy levels stable. Active, has a regular exercise routine.   Enjoys some usual interests and activities. Married. Lives with husband and 2 dogs. Has one son. Appetite adequate. Weight gain - 141 to 155 pounds - thyroid.  Sleeps well most nights. Averages 5 to 6 hours. Focus and concentration stable. Completing tasks. Managing aspects of household. Works full-time in Aeronautical engineer - recent promotion. Denies SI or HI. Denies AH or VH. Denies self harm. Denies substance use.   Review of Systems:  Review of Systems  Musculoskeletal:  Negative for gait problem.  Neurological:  Negative for tremors.  Psychiatric/Behavioral:         Please refer to HPI    Medications: I have reviewed the patient's current medications.  Current Outpatient Medications  Medication Sig Dispense Refill   amphetamine-dextroamphetamine (ADDERALL) 30 MG tablet Take 1 tablet by mouth 2 (two) times daily. 60 tablet 0   amphetamine-dextroamphetamine (ADDERALL) 30 MG tablet Take 1 tablet by mouth 2 (two) times daily. 60 tablet 0   amphetamine-dextroamphetamine (ADDERALL) 30 MG tablet Take 1 tablet by mouth 2 (two) times daily. 60 tablet 0   ARIPiprazole (ABILIFY) 5 MG  tablet Take 1 tablet (5 mg total) by mouth daily. 90 tablet 3   buPROPion (WELLBUTRIN XL) 150 MG 24 hr tablet Take 3 tablets by mouth every morning. 270 tablet 3   DULoxetine (CYMBALTA) 60 MG capsule Take 2 capsules (120 mg total) by mouth daily. 180 capsule 3   levothyroxine (SYNTHROID, LEVOTHROID) 125 MCG tablet Take 125 mcg by mouth daily.     No current facility-administered medications for this visit.    Medication Side Effects: None  Allergies:  Allergies  Allergen Reactions   Sulfa Antibiotics     Childhood allergy.    Sulfamethoxazole     Other reaction(s): unknown    No past medical history on file.  Past Medical History, Surgical history, Social history, and Family history were reviewed and updated as appropriate.   Please see review of systems for further details on the patient's review from today.   Objective:   Physical Exam:  There were no vitals taken for this visit.  Physical Exam Constitutional:      General: She is not in acute distress. Musculoskeletal:        General: No deformity.  Neurological:     Mental Status: She is alert and oriented to person, place, and time.     Coordination: Coordination normal.  Psychiatric:        Attention and Perception: Attention and perception normal. She does not perceive auditory or visual hallucinations.        Mood and Affect: Affect is not labile, blunt, angry or inappropriate.        Speech: Speech normal.  Behavior: Behavior normal.        Thought Content: Thought content normal. Thought content is not paranoid or delusional. Thought content does not include homicidal or suicidal ideation. Thought content does not include homicidal or suicidal plan.        Cognition and Memory: Cognition and memory normal.        Judgment: Judgment normal.     Comments: Insight intact     Lab Review:     Component Value Date/Time   NA 138 12/30/2017 1343   K 4.3 12/30/2017 1343   CL 101 12/30/2017 1343   CO2 27  12/30/2017 1336   GLUCOSE 87 12/30/2017 1343   BUN 13 12/30/2017 1343   CREATININE 0.90 12/30/2017 1343   CALCIUM 9.1 12/30/2017 1336   PROT 7.2 12/30/2017 1336   ALBUMIN 4.0 12/30/2017 1336   AST 22 12/30/2017 1336   ALT 16 12/30/2017 1336   ALKPHOS 50 12/30/2017 1336   BILITOT 0.7 12/30/2017 1336   GFRNONAA >60 12/30/2017 1336   GFRAA >60 12/30/2017 1336       Component Value Date/Time   WBC 5.9 12/30/2017 1336   RBC 4.35 12/30/2017 1336   HGB 14.3 12/30/2017 1343   HCT 42.0 12/30/2017 1343   PLT 275 12/30/2017 1336   MCV 94.9 12/30/2017 1336   MCH 30.6 12/30/2017 1336   MCHC 32.2 12/30/2017 1336   RDW 12.2 12/30/2017 1336   LYMPHSABS 1.8 12/30/2017 1336   MONOABS 0.6 12/30/2017 1336   EOSABS 0.2 12/30/2017 1336   BASOSABS 0.1 12/30/2017 1336    No results found for: "POCLITH", "LITHIUM"   No results found for: "PHENYTOIN", "PHENOBARB", "VALPROATE", "CBMZ"   .res Assessment: Plan:    Plan:  Adderall 30mg  daily to BID Wellbutrin XL 150mg  - 3 daily Cymbalta 60mg  - 2 daily   D/C Abilify 5mg  daily  Add Vraylar mg daily  Monitor BP between visits - WNL this visit  RTC 3 months  Patient advised to contact office with any questions, adverse effects, or acute worsening in signs and symptoms.  Discussed potential metabolic side effects associated with atypical antipsychotics, as well as potential risk for movement side effects. Advised pt to contact office if movement side effects occur.   Discussed potential benefits, risks, and side effects of stimulants with patient to include increased heart rate, palpitations, insomnia, increased anxiety, increased irritability, or decreased appetite.  Instructed patient to contact office if experiencing any significant tolerability issues. There are no diagnoses linked to this encounter.   Please see After Visit Summary for patient specific instructions.  Future Appointments  Date Time Provider Department Center   07/12/2022  9:00 AM Lakasha Mcfall, Thereasa Solo, NP [facility] None    No orders of the defined types were placed in this encounter.   -------------------------------

## 2022-07-16 DIAGNOSIS — F902 Attention-deficit hyperactivity disorder, combined type: Secondary | ICD-10-CM | POA: Diagnosis not present

## 2022-07-16 DIAGNOSIS — F39 Unspecified mood [affective] disorder: Secondary | ICD-10-CM | POA: Diagnosis not present

## 2022-07-16 DIAGNOSIS — F4312 Post-traumatic stress disorder, chronic: Secondary | ICD-10-CM | POA: Diagnosis not present

## 2022-07-31 DIAGNOSIS — F902 Attention-deficit hyperactivity disorder, combined type: Secondary | ICD-10-CM | POA: Diagnosis not present

## 2022-07-31 DIAGNOSIS — F39 Unspecified mood [affective] disorder: Secondary | ICD-10-CM | POA: Diagnosis not present

## 2022-07-31 DIAGNOSIS — F4312 Post-traumatic stress disorder, chronic: Secondary | ICD-10-CM | POA: Diagnosis not present

## 2022-08-09 ENCOUNTER — Ambulatory Visit: Payer: BC Managed Care – PPO | Admitting: Adult Health

## 2022-08-09 ENCOUNTER — Encounter: Payer: Self-pay | Admitting: Adult Health

## 2022-08-09 DIAGNOSIS — F411 Generalized anxiety disorder: Secondary | ICD-10-CM

## 2022-08-09 DIAGNOSIS — G4700 Insomnia, unspecified: Secondary | ICD-10-CM

## 2022-08-09 DIAGNOSIS — F3181 Bipolar II disorder: Secondary | ICD-10-CM

## 2022-08-09 DIAGNOSIS — F909 Attention-deficit hyperactivity disorder, unspecified type: Secondary | ICD-10-CM | POA: Diagnosis not present

## 2022-08-09 NOTE — Progress Notes (Signed)
Sandra Lyons [id number] 04-Jun-1970 52 y.o.  Subjective:   Patient ID:  Sandra Lyons is a 52 y.o. (DOB Mar 01, 1970) female.  Chief Complaint: No chief complaint on file.   HPI Sandra Lyons presents to the office today for follow-up of anxiety, insomnia, Bipolar 2 disorder and ADHD.   Describes mood today as "ok". Pleasant. Mood symptoms - reports decreased depression and irritability. Reports anxiety - "a little bit". Denies panic attacks. Denies worry and rumination and over thinking. Mood has improved.  Stating "I feel better". Reports current medication regimen is working as well. She and husband doing well. Stable interest and motivation. Taking medications as prescribed.  Energy levels getting better. Active, has a regular exercise routine.   Enjoys some usual interests and activities. Married. Lives with husband and 2 dogs. Has one son. Appetite adequate. Weight stable - 155 pounds - thyroid.  Sleeps well most nights. Averages 5 to 6 hours. Focus and concentration stable. Completing tasks. Managing aspects of household. Works full-time in [employer] company recent promotion. Denies SI or HI. Denies AH or VH. Denies self harm. Denies substance use.    Review of Systems:  Review of Systems  Musculoskeletal:  Negative for gait problem.  Neurological:  Negative for tremors.  Psychiatric/Behavioral:         Please refer to HPI    Medications: I have reviewed the patient's current medications.  Current Outpatient Medications  Medication Sig Dispense Refill   amphetamine-dextroamphetamine (ADDERALL) 30 MG tablet Take 1 tablet by mouth 2 (two) times daily. 60 tablet 0   amphetamine-dextroamphetamine (ADDERALL) 30 MG tablet Take 1 tablet by mouth 2 (two) times daily. 60 tablet 0   amphetamine-dextroamphetamine (ADDERALL) 30 MG tablet Take 1 tablet by mouth 2 (two) times daily. 60 tablet 0   buPROPion (WELLBUTRIN XL) 150 MG 24 hr tablet Take 3 tablets by mouth every morning.  270 tablet 3   DULoxetine (CYMBALTA) 60 MG capsule Take 2 capsules (120 mg total) by mouth daily. 180 capsule 3   levothyroxine (SYNTHROID, LEVOTHROID) 125 MCG tablet Take 125 mcg by mouth daily.     VRAYLAR 1.5 MG capsule Take 1 capsule by mouth daily. 30 capsule 2   No current facility-administered medications for this visit.    Medication Side Effects: None  Allergies:  Allergies  Allergen Reactions   Sulfa Antibiotics     Childhood allergy.    Sulfamethoxazole     Other reaction(s): unknown    No past medical history on file.  Past Medical History, Surgical history, Social history, and Family history were reviewed and updated as appropriate.   Please see review of systems for further details on the patient's review from today.   Objective:   Physical Exam:  There were no vitals taken for this visit.  Physical Exam Constitutional:      General: She is not in acute distress. Musculoskeletal:        General: No deformity.  Neurological:     Mental Status: She is alert and oriented to person, place, and time.     Coordination: Coordination normal.  Psychiatric:        Attention and Perception: Attention and perception normal. She does not perceive auditory or visual hallucinations.        Mood and Affect: Mood normal. Mood is not anxious or depressed. Affect is not labile, blunt, angry or inappropriate.        Speech: Speech normal.        Behavior: Behavior normal.  Thought Content: Thought content normal. Thought content is not paranoid or delusional. Thought content does not include homicidal or suicidal ideation. Thought content does not include homicidal or suicidal plan.        Cognition and Memory: Cognition and memory normal.        Judgment: Judgment normal.     Comments: Insight intact     Lab Review:     Component Value Date/Time   NA 138 12/30/2017 1343   K 4.3 12/30/2017 1343   CL 101 12/30/2017 1343   CO2 27 12/30/2017 1336   GLUCOSE 87  12/30/2017 1343   BUN 13 12/30/2017 1343   CREATININE 0.90 12/30/2017 1343   CALCIUM 9.1 12/30/2017 1336   PROT 7.2 12/30/2017 1336   ALBUMIN 4.0 12/30/2017 1336   AST 22 12/30/2017 1336   ALT 16 12/30/2017 1336   ALKPHOS 50 12/30/2017 1336   BILITOT 0.7 12/30/2017 1336   GFRNONAA >60 12/30/2017 1336   GFRAA >60 12/30/2017 1336       Component Value Date/Time   WBC 5.9 12/30/2017 1336   RBC 4.35 12/30/2017 1336   HGB 14.3 12/30/2017 1343   HCT 42.0 12/30/2017 1343   PLT 275 12/30/2017 1336   MCV 94.9 12/30/2017 1336   MCH 30.6 12/30/2017 1336   MCHC 32.2 12/30/2017 1336   RDW 12.2 12/30/2017 1336   LYMPHSABS 1.8 12/30/2017 1336   MONOABS 0.6 12/30/2017 1336   EOSABS 0.2 12/30/2017 1336   BASOSABS 0.1 12/30/2017 1336    No results found for: "POCLITH", "LITHIUM"   No results found for: "PHENYTOIN", "PHENOBARB", "VALPROATE", "CBMZ"   .res Assessment: Plan:    Plan:  Adderall 30mg  daily to BID Wellbutrin XL 150mg  - 3 daily Cymbalta 60mg  - 2 daily  Vraylar 1.5mg  daily  Monitor BP between visits - WNL this visit  RTC 3 months  Patient advised to contact office with any questions, adverse effects, or acute worsening in signs and symptoms.  Discussed potential metabolic side effects associated with atypical antipsychotics, as well as potential risk for movement side effects. Advised pt to contact office if movement side effects occur.   Discussed potential benefits, risks, and side effects of stimulants with patient to include increased heart rate, palpitations, insomnia, increased anxiety, increased irritability, or decreased appetite.  Instructed patient to contact office if experiencing any significant tolerability issues.  There are no diagnoses linked to this encounter.   Please see After Visit Summary for patient specific instructions.  No future appointments.  No orders of the defined types were placed in this  encounter.   -------------------------------

## 2022-08-13 DIAGNOSIS — F39 Unspecified mood [affective] disorder: Secondary | ICD-10-CM | POA: Diagnosis not present

## 2022-08-13 DIAGNOSIS — F4312 Post-traumatic stress disorder, chronic: Secondary | ICD-10-CM | POA: Diagnosis not present

## 2022-08-13 DIAGNOSIS — F902 Attention-deficit hyperactivity disorder, combined type: Secondary | ICD-10-CM | POA: Diagnosis not present

## 2022-08-20 ENCOUNTER — Emergency Department (HOSPITAL_BASED_OUTPATIENT_CLINIC_OR_DEPARTMENT_OTHER)
Admission: EM | Admit: 2022-08-20 | Discharge: 2022-08-20 | Disposition: A | Discharge status: Home / Self Care | Payer: Worker's Compensation | Attending: Emergency Medicine | Admitting: Emergency Medicine

## 2022-08-20 ENCOUNTER — Encounter (HOSPITAL_BASED_OUTPATIENT_CLINIC_OR_DEPARTMENT_OTHER): Payer: Self-pay | Admitting: Emergency Medicine

## 2022-08-20 ENCOUNTER — Other Ambulatory Visit: Payer: Self-pay

## 2022-08-20 ENCOUNTER — Emergency Department (HOSPITAL_BASED_OUTPATIENT_CLINIC_OR_DEPARTMENT_OTHER): Payer: Self-pay

## 2022-08-20 DIAGNOSIS — W01198A Fall on same level from slipping, tripping and stumbling with subsequent striking against other object, initial encounter: Secondary | ICD-10-CM | POA: Diagnosis not present

## 2022-08-20 DIAGNOSIS — S0990XA Unspecified injury of head, initial encounter: Secondary | ICD-10-CM | POA: Diagnosis present

## 2022-08-20 DIAGNOSIS — Y990 Civilian activity done for income or pay: Secondary | ICD-10-CM | POA: Diagnosis not present

## 2022-08-20 HISTORY — DX: Unspecified fracture of skull, initial encounter for closed fracture: S02.91XA

## 2022-08-20 NOTE — ED Provider Notes (Signed)
[place] EMERGENCY DEPARTMENT AT [facility] Provider Note   CSN: [id number] Arrival date & time: 08/20/22  1645     History  Chief Complaint  Patient presents with   Head Injury    Sandra Lyons is a 52 y.o. female.  Patient with remote history of a skull fracture in the 1990s presents today with complaints of head injury.  She states that same occurred on Friday (3 days ago) when she was mowing her yard with a standing mower. She states that she was going down a hill and lost control of her mower and fell backwards and hit the back of her head on concrete. She did not loose consciousness. She is not anticoagulated. She was able to get off the ground and walk around after without issue. Since then she has had persistent pain in the back of her head where she hit it. She denies any vision changes, neck pain, weakness, numbness/tingling, nausea, or vomiting. [facility] and was sent here for CT imaging. She denies any other injuries or complaints.  The history is provided by the patient. No language interpreter was used.  Head Injury Associated symptoms: headache        Home Medications Prior to Admission medications   Medication Sig Start Date End Date Taking? Authorizing Provider  amphetamine-dextroamphetamine (ADDERALL) 30 MG tablet Take 1 tablet by mouth 2 (two) times daily. 07/12/22   Mozingo, Thereasa Solo, NP  amphetamine-dextroamphetamine (ADDERALL) 30 MG tablet Take 1 tablet by mouth 2 (two) times daily. 07/12/22   Mozingo, Thereasa Solo, NP  amphetamine-dextroamphetamine (ADDERALL) 30 MG tablet Take 1 tablet by mouth 2 (two) times daily. 07/12/22   Mozingo, Thereasa Solo, NP  buPROPion (WELLBUTRIN XL) 150 MG 24 hr tablet Take 3 tablets by mouth every morning. 02/08/22   Mozingo, Thereasa Solo, NP  DULoxetine (CYMBALTA) 60 MG capsule Take 2 capsules (120 mg total) by mouth daily. 02/08/22   Mozingo, Thereasa Solo,  NP  levothyroxine (SYNTHROID, LEVOTHROID) 125 MCG tablet Take 125 mcg by mouth daily. 10/02/16   [provider]  VRAYLAR 1.5 MG capsule Take 1 capsule by mouth daily. 07/12/22   Mozingo, Thereasa Solo, NP      Allergies    Sulfa antibiotics and Sulfamethoxazole    Review of Systems   Review of Systems  Neurological:  Positive for headaches.  All other systems reviewed and are negative.   Physical Exam Updated Vital Signs BP (!) 123/90 (BP Location: Right Arm)   Pulse 78   Temp (!) 97.3 F (36.3 C)   Resp 18   Ht 5\' 4"  (1.626 m)   Wt 70.3 kg   LMP  (LMP Unknown)   SpO2 98%   BMI 26.61 kg/m  Physical Exam Vitals and nursing note reviewed.  Constitutional:      General: She is not in acute distress.    Appearance: Normal appearance. She is normal weight. She is not ill-appearing, toxic-appearing or diaphoretic.  HENT:     Head: Normocephalic and atraumatic.     Comments: No racoon eyes No battle sign  No hematoma, wound, deformity, or crepitus to the scalp. Eyes:     Extraocular Movements: Extraocular movements intact.     Pupils: Pupils are equal, round, and reactive to light.  Cardiovascular:     Rate and Rhythm: Normal rate.  Pulmonary:     Effort: Pulmonary effort is normal. No respiratory distress.  Musculoskeletal:  General: Normal range of motion.     Cervical back: Normal and normal range of motion.     Thoracic back: Normal.     Lumbar back: Normal.     Comments: No midline tenderness, no stepoffs or deformity noted on palpation of cervical, thoracic, and lumbar spine  Skin:    General: Skin is warm and dry.  Neurological:     General: No focal deficit present.     Mental Status: She is alert and oriented to person, place, and time.     GCS: GCS eye subscore is 4. GCS verbal subscore is 5. GCS motor subscore is 6.     Sensory: Sensation is intact.     Motor: Motor function is intact.     Coordination: Coordination is intact.     Gait:  Gait is intact.     Comments: Alert and oriented to self, place, time and event.    Speech is fluent, clear without dysarthria or dysphasia.    Strength 5/5 in upper/lower extremities   Sensation intact in upper/lower extremities   Patient observed to be ambulatory with steady gait  Psychiatric:        Mood and Affect: Mood normal.        Behavior: Behavior normal.     ED Results / Procedures / Treatments   Labs (all labs ordered are listed, but only abnormal results are displayed) Labs Reviewed - No data to display  EKG None  Radiology CT Head Wo Contrast  Result Date: 08/20/2022 CLINICAL DATA:  Head trauma, abnormal mental status (Age 61-64y) EXAM: CT HEAD WITHOUT CONTRAST TECHNIQUE: Contiguous axial images were obtained from the base of the skull through the vertex without intravenous contrast. RADIATION DOSE REDUCTION: This exam was performed according to the departmental dose-optimization program which includes automated exposure control, adjustment of the mA and/or kV according to patient size and/or use of iterative reconstruction technique. COMPARISON:  None Available. FINDINGS: Brain: No evidence of acute infarction, hemorrhage, hydrocephalus, extra-axial collection or mass lesion/mass effect. Vascular: No hyperdense vessel or unexpected calcification. Skull: Normal. Negative for fracture or focal lesion. Sinuses/Orbits: No acute finding. Other: None. IMPRESSION: No acute intracranial abnormality. Electronically Signed   By: Duanne Guess D.O.   On: 08/20/2022 18:55    Procedures Procedures    Medications Ordered in ED Medications - No data to display  ED Course/ Medical Decision Making/ A&P                                 Medical Decision Making Amount and/or Complexity of Data Reviewed Radiology: ordered.   Patient presents today with complaints of mechanical fall with head injury x 3 days ago. She is afebrile, non-toxic appearing, and in no acute distress  with reassuring vital signs.  She is also alert and oriented and neurologically intact without focal deficits.  Initially seen at urgent care given persistent headaches, sent here for CT imaging.  CT imaging has been obtained and has resulted and reveals no acute findings.  I have personally reviewed and interpreted this imaging and agree with radiology interpretation.  Discussed same with patient who is understanding and in agreement with this.  Recommend rest, Tylenol/ibuprofen as needed for pain, avoidance of bright lights and loud music as well as contact sports while she is having persistent pain as she could potentially have a concussion. Evaluation and diagnostic testing in the emergency department does not suggest  an emergent condition requiring admission or immediate intervention beyond what has been performed at this time.  Plan for discharge with close PCP follow-up.  Patient is understanding and amenable with plan, educated on red flag symptoms that would prompt immediate return.  Patient discharged in stable condition.  Final Clinical Impression(s) / ED Diagnoses Final diagnoses:  Injury of head, initial encounter    Rx / DC Orders ED Discharge Orders     None     An After Visit Summary was printed and given to the patient.     Vear Clock 08/20/22 1954    Ernie Avena, MD 08/21/22 [phone]

## 2022-08-20 NOTE — ED Triage Notes (Signed)
Hit head on Friday 1:15pm [facility] continued pain with some nausea. Upper left side of head pain Per patient sent for CT HX Skull fx mid 1990s

## 2022-08-20 NOTE — ED Notes (Signed)
ED Provider at bedside. 

## 2022-08-20 NOTE — Discharge Instructions (Signed)
As we discussed, your workup in the ER today was reassuring for acute findings.  CT imaging of your head did not reveal any acute abnormal findings.  I recommend getting rest and taking Tylenol/ibuprofen as needed for pain.  You could have a concussion and therefore I recommend avoiding things such as bright lights, loud music, limiting her screen time, and avoiding contact sports while you are symptomatic.  Follow-up with your primary care doctor as needed.  Return if development of any new or worsening symptoms.

## 2022-08-27 DIAGNOSIS — F4312 Post-traumatic stress disorder, chronic: Secondary | ICD-10-CM | POA: Diagnosis not present

## 2022-08-27 DIAGNOSIS — F39 Unspecified mood [affective] disorder: Secondary | ICD-10-CM | POA: Diagnosis not present

## 2022-08-27 DIAGNOSIS — F902 Attention-deficit hyperactivity disorder, combined type: Secondary | ICD-10-CM | POA: Diagnosis not present

## 2022-09-13 DIAGNOSIS — F4312 Post-traumatic stress disorder, chronic: Secondary | ICD-10-CM | POA: Diagnosis not present

## 2022-09-13 DIAGNOSIS — F39 Unspecified mood [affective] disorder: Secondary | ICD-10-CM | POA: Diagnosis not present

## 2022-09-13 DIAGNOSIS — F902 Attention-deficit hyperactivity disorder, combined type: Secondary | ICD-10-CM | POA: Diagnosis not present

## 2022-09-17 ENCOUNTER — Telehealth: Payer: Self-pay | Admitting: Adult Health

## 2022-09-17 DIAGNOSIS — F3181 Bipolar II disorder: Secondary | ICD-10-CM

## 2022-09-17 DIAGNOSIS — F411 Generalized anxiety disorder: Secondary | ICD-10-CM

## 2022-09-17 NOTE — Telephone Encounter (Signed)
Patient called in for refills on Wellbutrin 150mg , Cymbalta 60mg  and Vraylar 1.5mg . She would like prescriptions sent to [facility] [address] [facility]. Notes that she would like all prescriptions sent to [facility] going forward. She will no longer be using [facility]. Ph: [phone] Appt 11/8

## 2022-09-17 NOTE — Telephone Encounter (Signed)
Updated pharmacy profile. Need to send in RF.

## 2022-09-18 ENCOUNTER — Other Ambulatory Visit: Payer: Self-pay | Admitting: Adult Health

## 2022-09-18 DIAGNOSIS — F3181 Bipolar II disorder: Secondary | ICD-10-CM

## 2022-09-18 MED ORDER — CARIPRAZINE HCL 1.5 MG PO CAPS
1.5000 mg | ORAL_CAPSULE | Freq: Every day | ORAL | 0 refills | Status: DC
Start: 2022-09-18 — End: 2022-11-09

## 2022-09-18 MED ORDER — BUPROPION HCL ER (XL) 150 MG PO TB24
ORAL_TABLET | ORAL | 0 refills | Status: DC
Start: 2022-09-18 — End: 2022-11-09

## 2022-09-18 MED ORDER — DULOXETINE HCL 60 MG PO CPEP
120.0000 mg | ORAL_CAPSULE | Freq: Every day | ORAL | 0 refills | Status: DC
Start: 2022-09-18 — End: 2022-11-09

## 2022-09-18 NOTE — Telephone Encounter (Signed)
RF sent.

## 2022-09-19 ENCOUNTER — Other Ambulatory Visit: Payer: Self-pay

## 2022-09-19 NOTE — Telephone Encounter (Signed)
Prior Authorization initiated with [employer] approval for Buproprion

## 2022-09-24 DIAGNOSIS — F902 Attention-deficit hyperactivity disorder, combined type: Secondary | ICD-10-CM | POA: Diagnosis not present

## 2022-09-24 DIAGNOSIS — F4312 Post-traumatic stress disorder, chronic: Secondary | ICD-10-CM | POA: Diagnosis not present

## 2022-09-24 DIAGNOSIS — F39 Unspecified mood [affective] disorder: Secondary | ICD-10-CM | POA: Diagnosis not present

## 2022-10-08 DIAGNOSIS — F39 Unspecified mood [affective] disorder: Secondary | ICD-10-CM | POA: Diagnosis not present

## 2022-10-08 DIAGNOSIS — F902 Attention-deficit hyperactivity disorder, combined type: Secondary | ICD-10-CM | POA: Diagnosis not present

## 2022-10-08 DIAGNOSIS — F4312 Post-traumatic stress disorder, chronic: Secondary | ICD-10-CM | POA: Diagnosis not present

## 2022-10-22 DIAGNOSIS — F4312 Post-traumatic stress disorder, chronic: Secondary | ICD-10-CM | POA: Diagnosis not present

## 2022-10-22 DIAGNOSIS — F39 Unspecified mood [affective] disorder: Secondary | ICD-10-CM | POA: Diagnosis not present

## 2022-10-22 DIAGNOSIS — F902 Attention-deficit hyperactivity disorder, combined type: Secondary | ICD-10-CM | POA: Diagnosis not present

## 2022-11-05 DIAGNOSIS — F4312 Post-traumatic stress disorder, chronic: Secondary | ICD-10-CM | POA: Diagnosis not present

## 2022-11-05 DIAGNOSIS — F39 Unspecified mood [affective] disorder: Secondary | ICD-10-CM | POA: Diagnosis not present

## 2022-11-05 DIAGNOSIS — F902 Attention-deficit hyperactivity disorder, combined type: Secondary | ICD-10-CM | POA: Diagnosis not present

## 2022-11-09 ENCOUNTER — Ambulatory Visit: Payer: BC Managed Care – PPO | Admitting: Adult Health

## 2022-11-09 ENCOUNTER — Encounter: Payer: Self-pay | Admitting: Adult Health

## 2022-11-09 DIAGNOSIS — G4700 Insomnia, unspecified: Secondary | ICD-10-CM | POA: Diagnosis not present

## 2022-11-09 DIAGNOSIS — F909 Attention-deficit hyperactivity disorder, unspecified type: Secondary | ICD-10-CM | POA: Diagnosis not present

## 2022-11-09 DIAGNOSIS — F411 Generalized anxiety disorder: Secondary | ICD-10-CM | POA: Diagnosis not present

## 2022-11-09 DIAGNOSIS — F3181 Bipolar II disorder: Secondary | ICD-10-CM | POA: Diagnosis not present

## 2022-11-09 MED ORDER — AMPHETAMINE-DEXTROAMPHETAMINE 30 MG PO TABS
30.0000 mg | ORAL_TABLET | Freq: Two times a day (BID) | ORAL | 0 refills | Status: DC
Start: 2022-12-07 — End: 2023-02-08

## 2022-11-09 MED ORDER — BUPROPION HCL ER (XL) 150 MG PO TB24
ORAL_TABLET | ORAL | 0 refills | Status: DC
Start: 2022-11-09 — End: 2023-02-08

## 2022-11-09 MED ORDER — TRAZODONE HCL 50 MG PO TABS: 50.0000 mg | ORAL_TABLET | Freq: Every day | ORAL | 2 refills | Status: DC

## 2022-11-09 MED ORDER — AMPHETAMINE-DEXTROAMPHETAMINE 30 MG PO TABS
30.0000 mg | ORAL_TABLET | Freq: Two times a day (BID) | ORAL | 0 refills | Status: DC
Start: 2023-01-04 — End: 2023-02-08

## 2022-11-09 MED ORDER — CARIPRAZINE HCL 1.5 MG PO CAPS: 1.5000 mg | ORAL_CAPSULE | Freq: Every day | ORAL | 0 refills | Status: DC

## 2022-11-09 MED ORDER — AMPHETAMINE-DEXTROAMPHETAMINE 30 MG PO TABS
30.0000 mg | ORAL_TABLET | Freq: Two times a day (BID) | ORAL | 0 refills | Status: DC
Start: 2022-11-09 — End: 2023-02-08

## 2022-11-09 MED ORDER — DULOXETINE HCL 60 MG PO CPEP: 120.0000 mg | ORAL_CAPSULE | Freq: Every day | ORAL | 0 refills | Status: DC

## 2022-11-09 NOTE — Progress Notes (Signed)
Sandra Lyons [id number] [birth year] 52 y.o.  Subjective:   Patient ID:  Sandra Lyons is a 51 y.o. (DOB 03-01-1970) female.  Chief Complaint: No chief complaint on file.   HPI Sandra Lyons presents to the office today for follow-up of anxiety, insomnia, Bipolar 2 disorder and ADHD.   Describes mood today as "ok". Pleasant. Mood symptoms - reports some depression. Denies anxiety and irritability. Denies panic attacks. Denies worry and rumination and over thinking. Mood is stable. Stating "I feel like I'm doing ok". Reports current medication regimen is helpful. She and husband doing well. Stable interest and motivation. Taking medications as prescribed.  Energy levels getting better. Active, does not have a regular exercise routine.   Enjoys some usual interests and activities. Married. Lives with husband and 2 dogs. Has one son. Appetite adequate. Weight stable - 155 pounds.  Reports sleeping difficulties. Averages 5 hours. Focus and concentration stable. Completing tasks. Managing aspects of household. Currently unemployed - looking for a job.  Denies SI or HI. Denies AH or VH. Denies self harm. Denies substance use.    Flowsheet Row ED from 08/20/2022 in [facility] Emergency Department at [facility]  C-SSRS RISK CATEGORY No Risk        Review of Systems:  Review of Systems  Musculoskeletal:  Negative for gait problem.  Neurological:  Negative for tremors.  Psychiatric/Behavioral:         Please refer to HPI    Medications: I have reviewed the patient's current medications.  Current Outpatient Medications  Medication Sig Dispense Refill   amphetamine-dextroamphetamine (ADDERALL) 30 MG tablet Take 1 tablet by mouth 2 (two) times daily. 60 tablet 0   amphetamine-dextroamphetamine (ADDERALL) 30 MG tablet Take 1 tablet by mouth 2 (two) times daily. 60 tablet 0   amphetamine-dextroamphetamine (ADDERALL) 30 MG tablet Take 1 tablet by mouth 2 (two) times daily. 60  tablet 0   buPROPion (WELLBUTRIN XL) 150 MG 24 hr tablet Take 3 tablets by mouth every morning. 270 tablet 0   cariprazine (VRAYLAR) 1.5 MG capsule Take 1 capsule (1.5 mg total) by mouth daily. 90 capsule 0   DULoxetine (CYMBALTA) 60 MG capsule Take 2 capsules (120 mg total) by mouth daily. 180 capsule 0   levothyroxine (SYNTHROID, LEVOTHROID) 125 MCG tablet Take 125 mcg by mouth daily.     No current facility-administered medications for this visit.    Medication Side Effects: None  Allergies:  Allergies  Allergen Reactions   Sulfa Antibiotics     Childhood allergy.    Sulfamethoxazole     Other reaction(s): unknown    Past Medical History:  Diagnosis Date   Skull fracture [facility])     Past Medical History, Surgical history, Social history, and Family history were reviewed and updated as appropriate.   Please see review of systems for further details on the patient's review from today.   Objective:   Physical Exam:  LMP  (LMP Unknown)   Physical Exam Constitutional:      General: She is not in acute distress. Musculoskeletal:        General: No deformity.  Neurological:     Mental Status: She is alert and oriented to person, place, and time.     Coordination: Coordination normal.  Psychiatric:        Attention and Perception: Attention and perception normal. She does not perceive auditory or visual hallucinations.        Mood and Affect: Mood normal. Mood is not anxious or depressed.  Affect is not labile, blunt, angry or inappropriate.        Speech: Speech normal.        Behavior: Behavior normal.        Thought Content: Thought content normal. Thought content is not paranoid or delusional. Thought content does not include homicidal or suicidal ideation. Thought content does not include homicidal or suicidal plan.        Cognition and Memory: Cognition and memory normal.        Judgment: Judgment normal.     Comments: Insight intact     Lab Review:     Component  Value Date/Time   NA 138 12/30/2017 1343   K 4.3 12/30/2017 1343   CL 101 12/30/2017 1343   CO2 27 12/30/2017 1336   GLUCOSE 87 12/30/2017 1343   BUN 13 12/30/2017 1343   CREATININE 0.90 12/30/2017 1343   CALCIUM 9.1 12/30/2017 1336   PROT 7.2 12/30/2017 1336   ALBUMIN 4.0 12/30/2017 1336   AST 22 12/30/2017 1336   ALT 16 12/30/2017 1336   ALKPHOS 50 12/30/2017 1336   BILITOT 0.7 12/30/2017 1336   GFRNONAA >60 12/30/2017 1336   GFRAA >60 12/30/2017 1336       Component Value Date/Time   WBC 5.9 12/30/2017 1336   RBC 4.35 12/30/2017 1336   HGB 14.3 12/30/2017 1343   HCT 42.0 12/30/2017 1343   PLT 275 12/30/2017 1336   MCV 94.9 12/30/2017 1336   MCH 30.6 12/30/2017 1336   MCHC 32.2 12/30/2017 1336   RDW 12.2 12/30/2017 1336   LYMPHSABS 1.8 12/30/2017 1336   MONOABS 0.6 12/30/2017 1336   EOSABS 0.2 12/30/2017 1336   BASOSABS 0.1 12/30/2017 1336    No results found for: "POCLITH", "LITHIUM"   No results found for: "PHENYTOIN", "PHENOBARB", "VALPROATE", "CBMZ"   .res Assessment: Plan:    Plan:  Adderall 30mg  daily to BID Wellbutrin XL 150mg  - 3 daily Cymbalta 60mg  - 2 daily  Vraylar 1.5mg  daily  Monitor BP between visits - WNL this visit  RTC 3 months  Patient advised to contact office with any questions, adverse effects, or acute worsening in signs and symptoms.  Discussed potential metabolic side effects associated with atypical antipsychotics, as well as potential risk for movement side effects. Advised pt to contact office if movement side effects occur.   Discussed potential benefits, risks, and side effects of stimulants with patient to include increased heart rate, palpitations, insomnia, increased anxiety, increased irritability, or decreased appetite.  Instructed patient to contact office if experiencing any significant tolerability issues. There are no diagnoses linked to this encounter.   Please see After Visit Summary for patient specific  instructions.  Future Appointments  Date Time Provider Department Center  11/09/2022  9:00 AM Cristen Bredeson, Thereasa Solo, NP [facility] None    No orders of the defined types were placed in this encounter.   -------------------------------

## 2022-12-03 DIAGNOSIS — F902 Attention-deficit hyperactivity disorder, combined type: Secondary | ICD-10-CM | POA: Diagnosis not present

## 2022-12-03 DIAGNOSIS — F4312 Post-traumatic stress disorder, chronic: Secondary | ICD-10-CM | POA: Diagnosis not present

## 2022-12-03 DIAGNOSIS — F39 Unspecified mood [affective] disorder: Secondary | ICD-10-CM | POA: Diagnosis not present

## 2022-12-05 DIAGNOSIS — N1831 Chronic kidney disease, stage 3a: Secondary | ICD-10-CM | POA: Diagnosis not present

## 2022-12-05 DIAGNOSIS — I10 Essential (primary) hypertension: Secondary | ICD-10-CM | POA: Diagnosis not present

## 2022-12-05 DIAGNOSIS — E039 Hypothyroidism, unspecified: Secondary | ICD-10-CM | POA: Diagnosis not present

## 2022-12-07 DIAGNOSIS — I10 Essential (primary) hypertension: Secondary | ICD-10-CM | POA: Diagnosis not present

## 2022-12-19 DIAGNOSIS — Z01419 Encounter for gynecological examination (general) (routine) without abnormal findings: Secondary | ICD-10-CM | POA: Diagnosis not present

## 2022-12-19 DIAGNOSIS — Z124 Encounter for screening for malignant neoplasm of cervix: Secondary | ICD-10-CM | POA: Diagnosis not present

## 2022-12-19 DIAGNOSIS — Z30431 Encounter for routine checking of intrauterine contraceptive device: Secondary | ICD-10-CM | POA: Diagnosis not present

## 2022-12-19 DIAGNOSIS — Z23 Encounter for immunization: Secondary | ICD-10-CM | POA: Diagnosis not present

## 2022-12-24 DIAGNOSIS — Z7189 Other specified counseling: Secondary | ICD-10-CM | POA: Diagnosis not present

## 2022-12-24 DIAGNOSIS — D225 Melanocytic nevi of trunk: Secondary | ICD-10-CM | POA: Diagnosis not present

## 2022-12-24 DIAGNOSIS — L820 Inflamed seborrheic keratosis: Secondary | ICD-10-CM | POA: Diagnosis not present

## 2022-12-24 DIAGNOSIS — L538 Other specified erythematous conditions: Secondary | ICD-10-CM | POA: Diagnosis not present

## 2022-12-24 DIAGNOSIS — L2989 Other pruritus: Secondary | ICD-10-CM | POA: Diagnosis not present

## 2022-12-24 DIAGNOSIS — L814 Other melanin hyperpigmentation: Secondary | ICD-10-CM | POA: Diagnosis not present

## 2022-12-31 DIAGNOSIS — F39 Unspecified mood [affective] disorder: Secondary | ICD-10-CM | POA: Diagnosis not present

## 2022-12-31 DIAGNOSIS — H40033 Anatomical narrow angle, bilateral: Secondary | ICD-10-CM | POA: Diagnosis not present

## 2022-12-31 DIAGNOSIS — F902 Attention-deficit hyperactivity disorder, combined type: Secondary | ICD-10-CM | POA: Diagnosis not present

## 2022-12-31 DIAGNOSIS — H1013 Acute atopic conjunctivitis, bilateral: Secondary | ICD-10-CM | POA: Diagnosis not present

## 2022-12-31 DIAGNOSIS — F4312 Post-traumatic stress disorder, chronic: Secondary | ICD-10-CM | POA: Diagnosis not present

## 2023-01-07 DIAGNOSIS — F902 Attention-deficit hyperactivity disorder, combined type: Secondary | ICD-10-CM | POA: Diagnosis not present

## 2023-01-07 DIAGNOSIS — F4312 Post-traumatic stress disorder, chronic: Secondary | ICD-10-CM | POA: Diagnosis not present

## 2023-01-07 DIAGNOSIS — F39 Unspecified mood [affective] disorder: Secondary | ICD-10-CM | POA: Diagnosis not present

## 2023-01-19 ENCOUNTER — Other Ambulatory Visit: Payer: Self-pay | Admitting: Adult Health

## 2023-01-19 DIAGNOSIS — F411 Generalized anxiety disorder: Secondary | ICD-10-CM

## 2023-01-24 DIAGNOSIS — F39 Unspecified mood [affective] disorder: Secondary | ICD-10-CM | POA: Diagnosis not present

## 2023-01-24 DIAGNOSIS — F4312 Post-traumatic stress disorder, chronic: Secondary | ICD-10-CM | POA: Diagnosis not present

## 2023-01-24 DIAGNOSIS — F902 Attention-deficit hyperactivity disorder, combined type: Secondary | ICD-10-CM | POA: Diagnosis not present

## 2023-02-04 DIAGNOSIS — F39 Unspecified mood [affective] disorder: Secondary | ICD-10-CM | POA: Diagnosis not present

## 2023-02-04 DIAGNOSIS — F902 Attention-deficit hyperactivity disorder, combined type: Secondary | ICD-10-CM | POA: Diagnosis not present

## 2023-02-04 DIAGNOSIS — F4312 Post-traumatic stress disorder, chronic: Secondary | ICD-10-CM | POA: Diagnosis not present

## 2023-02-04 DIAGNOSIS — E039 Hypothyroidism, unspecified: Secondary | ICD-10-CM | POA: Diagnosis not present

## 2023-02-08 ENCOUNTER — Encounter: Payer: Self-pay | Admitting: Adult Health

## 2023-02-08 ENCOUNTER — Telehealth: Payer: BC Managed Care – PPO | Admitting: Adult Health

## 2023-02-08 DIAGNOSIS — F3181 Bipolar II disorder: Secondary | ICD-10-CM | POA: Diagnosis not present

## 2023-02-08 DIAGNOSIS — F411 Generalized anxiety disorder: Secondary | ICD-10-CM

## 2023-02-08 DIAGNOSIS — F419 Anxiety disorder, unspecified: Secondary | ICD-10-CM

## 2023-02-08 DIAGNOSIS — F909 Attention-deficit hyperactivity disorder, unspecified type: Secondary | ICD-10-CM

## 2023-02-08 DIAGNOSIS — G4700 Insomnia, unspecified: Secondary | ICD-10-CM | POA: Diagnosis not present

## 2023-02-08 MED ORDER — TRAZODONE HCL 100 MG PO TABS: 50.0000 mg | ORAL_TABLET | Freq: Every day | ORAL | 1 refills | Status: DC

## 2023-02-08 MED ORDER — CARIPRAZINE HCL 1.5 MG PO CAPS: 1.5000 mg | ORAL_CAPSULE | Freq: Every day | ORAL | 0 refills | Status: DC

## 2023-02-08 MED ORDER — AMPHETAMINE-DEXTROAMPHETAMINE 30 MG PO TABS: 30.0000 mg | ORAL_TABLET | Freq: Two times a day (BID) | ORAL | 0 refills | Status: DC

## 2023-02-08 MED ORDER — DULOXETINE HCL 60 MG PO CPEP: 120.0000 mg | ORAL_CAPSULE | Freq: Every day | ORAL | 1 refills | Status: DC

## 2023-02-08 MED ORDER — BUPROPION HCL ER (XL) 150 MG PO TB24: ORAL_TABLET | ORAL | 0 refills | Status: DC

## 2023-02-08 NOTE — Progress Notes (Signed)
 Sandra Lyons [id number] 04/11/1970 53 y.o.  Virtual Visit via Video Note  I connected with pt @ on 02/08/23 at  1:30 PM EST by a video enabled telemedicine application and verified that I am speaking with the correct person using two identifiers.   I discussed the limitations of evaluation and management by telemedicine and the availability of in person appointments. The patient expressed understanding and agreed to proceed.  I discussed the assessment and treatment plan with the patient. The patient was provided an opportunity to ask questions and all were answered. The patient agreed with the plan and demonstrated an understanding of the instructions.   The patient was advised to call back or seek an in-person evaluation if the symptoms worsen or if the condition fails to improve as anticipated.  I provided 25 minutes of non-face-to-face time during this encounter.  The patient was located at home.  The provider was located at [facility].   Sandra LOISE Sayers, NP   Subjective:   Patient ID:  Sandra Lyons.  Chief Complaint: No chief complaint on file.   HPI Sandra Lyons presents for follow-up of anxiety, insomnia, Bipolar 2 disorder and ADHD.   Describes mood today as ok. Pleasant. Mood symptoms - reports some depression, anxiety and irritability. Varying interest and motivation. Denies panic attacks. Denies worry and rumination and over thinking. Mood is stable. Stating I think I'm ok. Reports current medication regimen is helpful. She and husband doing well. Taking medications as prescribed.  Energy levels lower. Active, does not have a regular exercise routine.   Enjoys some usual interests and activities. Married. Lives with husband and 2 dogs. Has one son. Appetite adequate. Weight gain - 20 pounds. Reports sleeping difficulties. Averages 5 hours. Focus and concentration stable. Completing tasks. Managing aspects of  household. Currently unemployed - looking for a job.  Denies SI or HI. Denies AH or VH. Denies self harm. Denies substance use.  Review of Systems:  Review of Systems  Musculoskeletal:  Negative for gait problem.  Neurological:  Negative for tremors.  Psychiatric/Behavioral:         Please refer to HPI    Medications: I have reviewed the patient's current medications.  Current Outpatient Medications  Medication Sig Dispense Refill   amphetamine -dextroamphetamine  (ADDERALL) 30 MG tablet Take 1 tablet by mouth 2 (two) times daily. 60 tablet 0   amphetamine -dextroamphetamine  (ADDERALL) 30 MG tablet Take 1 tablet by mouth 2 (two) times daily. 60 tablet 0   amphetamine -dextroamphetamine  (ADDERALL) 30 MG tablet Take 1 tablet by mouth 2 (two) times daily. 60 tablet 0   buPROPion  (WELLBUTRIN  XL) 150 MG 24 hr tablet Take 3 tablets by mouth every morning. 270 tablet 0   cariprazine  (VRAYLAR ) 1.5 MG capsule Take 1 capsule (1.5 mg total) by mouth daily. 90 capsule 0   DULoxetine  (CYMBALTA ) 60 MG capsule Take 2 capsules by mouth daily. 180 capsule 1   levothyroxine (SYNTHROID, LEVOTHROID) 125 MCG tablet Take 125 mcg by mouth daily.     traZODone  (DESYREL ) 50 MG tablet Take 1 tablet (50 mg total) by mouth at bedtime. 30 tablet 2   No current facility-administered medications for this visit.    Medication Side Effects: None  Allergies:  Allergies  Allergen Reactions   Sulfa Antibiotics     Childhood allergy.    Sulfamethoxazole     Other reaction(s): unknown    Past Medical History:  Diagnosis Date   Skull fracture (HCC)  No family history on file.  Social History   Socioeconomic History   Marital status: Married    Spouse name: Not on file   Number of children: Not on file   Years of education: Not on file   Highest education level: Not on file  Occupational History   Not on file  Tobacco Use   Smoking status: Never   Smokeless tobacco: Never  Substance and Sexual  Activity   Alcohol use: Yes   Drug use: Not on file   Sexual activity: Not on file  Other Topics Concern   Not on file  Social History Narrative   Not on file   Social Drivers of Health   Financial Resource Strain: Not on file  Food Insecurity: Not on file  Transportation Needs: Not on file  Physical Activity: Not on file  Stress: Not on file  Social Connections: Not on file  Intimate Partner Violence: Not on file    Past Medical History, Surgical history, Social history, and Family history were reviewed and updated as appropriate.   Please see review of systems for further details on the patient's review from today.   Objective:   Physical Exam:  LMP  (LMP Unknown)   Physical Exam Constitutional:      General: She is not in acute distress. Musculoskeletal:        General: No deformity.  Neurological:     Mental Status: She is alert and oriented to person, place, and time.     Coordination: Coordination normal.  Psychiatric:        Attention and Perception: Attention and perception normal. She does not perceive auditory or visual hallucinations.        Mood and Affect: Mood normal. Mood is not anxious or depressed. Affect is not labile, blunt, angry or inappropriate.        Speech: Speech normal.        Behavior: Behavior normal.        Thought Content: Thought content normal. Thought content is not paranoid or delusional. Thought content does not include homicidal or suicidal ideation. Thought content does not include homicidal or suicidal plan.        Cognition and Memory: Cognition and memory normal.        Judgment: Judgment normal.     Comments: Insight intact     Lab Review:     Component Value Date/Time   NA 138 12/30/2017 1343   K 4.3 12/30/2017 1343   CL 101 12/30/2017 1343   CO2 27 12/30/2017 1336   GLUCOSE 87 12/30/2017 1343   BUN 13 12/30/2017 1343   CREATININE 0.90 12/30/2017 1343   CALCIUM 9.1 12/30/2017 1336   PROT 7.2 12/30/2017 1336    ALBUMIN 4.0 12/30/2017 1336   AST 22 12/30/2017 1336   ALT 16 12/30/2017 1336   ALKPHOS 50 12/30/2017 1336   BILITOT 0.7 12/30/2017 1336   GFRNONAA >60 12/30/2017 1336   GFRAA >60 12/30/2017 1336       Component Value Date/Time   WBC 5.9 12/30/2017 1336   RBC 4.35 12/30/2017 1336   HGB 14.3 12/30/2017 1343   HCT 42.0 12/30/2017 1343   PLT 275 12/30/2017 1336   MCV 94.9 12/30/2017 1336   MCH 30.6 12/30/2017 1336   MCHC 32.2 12/30/2017 1336   RDW 12.2 12/30/2017 1336   LYMPHSABS 1.8 12/30/2017 1336   MONOABS 0.6 12/30/2017 1336   EOSABS 0.2 12/30/2017 1336   BASOSABS 0.1 12/30/2017 1336  No results found for: POCLITH, LITHIUM   No results found for: PHENYTOIN, PHENOBARB, VALPROATE, CBMZ   .res Assessment: Plan:    Plan:  Adderall 30mg  daily to BID Wellbutrin  XL 150mg  - 3 daily Cymbalta  60mg  - 2 daily  Vraylar  1.5mg  daily  Increase Trazadone 50mg  to 100mg  at bedtime  Monitor BP between visits - WNL this visit  RTC 3 months  25 minutes spent dedicated to the care of this patient on the date of this encounter to include pre-visit review of records, ordering of medication, post visit documentation, and face-to-face time with the patient discussing anxiety, insomnia, Bipolar 2 disorder and ADHD. Discussed continuing current medication regimen.  Patient advised to contact office with any questions, adverse effects, or acute worsening in signs and symptoms.  Discussed potential metabolic side effects associated with atypical antipsychotics, as well as potential risk for movement side effects. Advised pt to contact office if movement side effects occur.   Discussed potential benefits, risks, and side effects of stimulants with patient to include increased heart rate, palpitations, insomnia, increased anxiety, increased irritability, or decreased appetite.  Instructed patient to contact office if experiencing any significant tolerability issues. There are no  diagnoses linked to this encounter.   Please see After Visit Summary for patient specific instructions.  Future Appointments  Date Time Provider Department Center  02/08/2023  1:30 PM Alekai Pocock Nattalie, NP [facility] None    No orders of the defined types were placed in this encounter.     -------------------------------

## 2023-02-20 DIAGNOSIS — F4312 Post-traumatic stress disorder, chronic: Secondary | ICD-10-CM | POA: Diagnosis not present

## 2023-02-20 DIAGNOSIS — F902 Attention-deficit hyperactivity disorder, combined type: Secondary | ICD-10-CM | POA: Diagnosis not present

## 2023-02-20 DIAGNOSIS — F39 Unspecified mood [affective] disorder: Secondary | ICD-10-CM | POA: Diagnosis not present

## 2023-03-04 ENCOUNTER — Other Ambulatory Visit: Payer: Self-pay | Admitting: Adult Health

## 2023-03-04 DIAGNOSIS — F902 Attention-deficit hyperactivity disorder, combined type: Secondary | ICD-10-CM | POA: Diagnosis not present

## 2023-03-04 DIAGNOSIS — F39 Unspecified mood [affective] disorder: Secondary | ICD-10-CM | POA: Diagnosis not present

## 2023-03-04 DIAGNOSIS — F4312 Post-traumatic stress disorder, chronic: Secondary | ICD-10-CM | POA: Diagnosis not present

## 2023-03-05 NOTE — Telephone Encounter (Signed)
 How is she taking? Last note says increase from 50 to 100 mg.

## 2023-03-07 DIAGNOSIS — M79641 Pain in right hand: Secondary | ICD-10-CM | POA: Diagnosis not present

## 2023-03-07 DIAGNOSIS — M79642 Pain in left hand: Secondary | ICD-10-CM | POA: Diagnosis not present

## 2023-03-18 DIAGNOSIS — F902 Attention-deficit hyperactivity disorder, combined type: Secondary | ICD-10-CM | POA: Diagnosis not present

## 2023-03-18 DIAGNOSIS — F39 Unspecified mood [affective] disorder: Secondary | ICD-10-CM | POA: Diagnosis not present

## 2023-03-18 DIAGNOSIS — F4312 Post-traumatic stress disorder, chronic: Secondary | ICD-10-CM | POA: Diagnosis not present

## 2023-04-01 DIAGNOSIS — F39 Unspecified mood [affective] disorder: Secondary | ICD-10-CM | POA: Diagnosis not present

## 2023-04-01 DIAGNOSIS — F4312 Post-traumatic stress disorder, chronic: Secondary | ICD-10-CM | POA: Diagnosis not present

## 2023-04-01 DIAGNOSIS — F902 Attention-deficit hyperactivity disorder, combined type: Secondary | ICD-10-CM | POA: Diagnosis not present

## 2023-04-02 DIAGNOSIS — E039 Hypothyroidism, unspecified: Secondary | ICD-10-CM | POA: Diagnosis not present

## 2023-04-15 DIAGNOSIS — F4312 Post-traumatic stress disorder, chronic: Secondary | ICD-10-CM | POA: Diagnosis not present

## 2023-04-15 DIAGNOSIS — F902 Attention-deficit hyperactivity disorder, combined type: Secondary | ICD-10-CM | POA: Diagnosis not present

## 2023-04-15 DIAGNOSIS — F39 Unspecified mood [affective] disorder: Secondary | ICD-10-CM | POA: Diagnosis not present

## 2023-04-18 ENCOUNTER — Telehealth: Payer: Self-pay | Admitting: Adult Health

## 2023-04-18 NOTE — Telephone Encounter (Signed)
 Lorella Roles, therapist with [facility] called requesting a consult on Sandra Lyons, your patient. Phone number is [phone], extension [phone]. Will you need a release to talk to her? Rice Chamorro

## 2023-04-18 NOTE — Telephone Encounter (Signed)
 LVM to [facility]

## 2023-04-22 NOTE — Telephone Encounter (Signed)
 Sandra Lyons called back. She is reporting patient is dissociating, not sure to the point of an identity disorder. She is wondering if any psych testing had been done here.  She is trying to see if she needed to refer her to someone else.  [phone] Ext [phone].

## 2023-05-02 DIAGNOSIS — N1831 Chronic kidney disease, stage 3a: Secondary | ICD-10-CM | POA: Diagnosis not present

## 2023-05-02 DIAGNOSIS — I10 Essential (primary) hypertension: Secondary | ICD-10-CM | POA: Diagnosis not present

## 2023-05-02 DIAGNOSIS — Z23 Encounter for immunization: Secondary | ICD-10-CM | POA: Diagnosis not present

## 2023-05-02 DIAGNOSIS — E782 Mixed hyperlipidemia: Secondary | ICD-10-CM | POA: Diagnosis not present

## 2023-05-02 DIAGNOSIS — E039 Hypothyroidism, unspecified: Secondary | ICD-10-CM | POA: Diagnosis not present

## 2023-05-02 DIAGNOSIS — Z0000 Encounter for general adult medical examination without abnormal findings: Secondary | ICD-10-CM | POA: Diagnosis not present

## 2023-05-10 DIAGNOSIS — E782 Mixed hyperlipidemia: Secondary | ICD-10-CM | POA: Diagnosis not present

## 2023-05-10 DIAGNOSIS — Z0000 Encounter for general adult medical examination without abnormal findings: Secondary | ICD-10-CM | POA: Diagnosis not present

## 2023-05-10 DIAGNOSIS — N1831 Chronic kidney disease, stage 3a: Secondary | ICD-10-CM | POA: Diagnosis not present

## 2023-05-10 DIAGNOSIS — I10 Essential (primary) hypertension: Secondary | ICD-10-CM | POA: Diagnosis not present

## 2023-05-10 DIAGNOSIS — E039 Hypothyroidism, unspecified: Secondary | ICD-10-CM | POA: Diagnosis not present

## 2023-06-03 DIAGNOSIS — F39 Unspecified mood [affective] disorder: Secondary | ICD-10-CM | POA: Diagnosis not present

## 2023-06-03 DIAGNOSIS — F902 Attention-deficit hyperactivity disorder, combined type: Secondary | ICD-10-CM | POA: Diagnosis not present

## 2023-06-03 DIAGNOSIS — F4312 Post-traumatic stress disorder, chronic: Secondary | ICD-10-CM | POA: Diagnosis not present

## 2023-06-10 DIAGNOSIS — F4312 Post-traumatic stress disorder, chronic: Secondary | ICD-10-CM | POA: Diagnosis not present

## 2023-06-10 DIAGNOSIS — F902 Attention-deficit hyperactivity disorder, combined type: Secondary | ICD-10-CM | POA: Diagnosis not present

## 2023-06-10 DIAGNOSIS — F39 Unspecified mood [affective] disorder: Secondary | ICD-10-CM | POA: Diagnosis not present

## 2023-06-13 DIAGNOSIS — F39 Unspecified mood [affective] disorder: Secondary | ICD-10-CM | POA: Diagnosis not present

## 2023-06-13 DIAGNOSIS — F902 Attention-deficit hyperactivity disorder, combined type: Secondary | ICD-10-CM | POA: Diagnosis not present

## 2023-06-13 DIAGNOSIS — F4312 Post-traumatic stress disorder, chronic: Secondary | ICD-10-CM | POA: Diagnosis not present

## 2023-06-25 ENCOUNTER — Other Ambulatory Visit: Payer: Self-pay | Admitting: Adult Health

## 2023-06-25 DIAGNOSIS — F909 Attention-deficit hyperactivity disorder, unspecified type: Secondary | ICD-10-CM

## 2023-06-25 DIAGNOSIS — F411 Generalized anxiety disorder: Secondary | ICD-10-CM

## 2023-06-25 NOTE — Telephone Encounter (Signed)
Please call to schedule FU, was due in May.

## 2023-06-26 DIAGNOSIS — F902 Attention-deficit hyperactivity disorder, combined type: Secondary | ICD-10-CM | POA: Diagnosis not present

## 2023-06-26 DIAGNOSIS — F39 Unspecified mood [affective] disorder: Secondary | ICD-10-CM | POA: Diagnosis not present

## 2023-06-26 DIAGNOSIS — F4312 Post-traumatic stress disorder, chronic: Secondary | ICD-10-CM | POA: Diagnosis not present

## 2023-06-28 NOTE — Telephone Encounter (Signed)
Lvm to sch appt

## 2023-07-01 DIAGNOSIS — F902 Attention-deficit hyperactivity disorder, combined type: Secondary | ICD-10-CM | POA: Diagnosis not present

## 2023-07-01 DIAGNOSIS — F39 Unspecified mood [affective] disorder: Secondary | ICD-10-CM | POA: Diagnosis not present

## 2023-07-01 DIAGNOSIS — F4312 Post-traumatic stress disorder, chronic: Secondary | ICD-10-CM | POA: Diagnosis not present

## 2023-07-03 NOTE — Telephone Encounter (Signed)
 Next appt is 8/20. Requesting refill on Adderall, and Bupropion 

## 2023-07-04 ENCOUNTER — Telehealth: Payer: Self-pay | Admitting: Adult Health

## 2023-07-04 ENCOUNTER — Other Ambulatory Visit: Payer: Self-pay

## 2023-07-04 DIAGNOSIS — F909 Attention-deficit hyperactivity disorder, unspecified type: Secondary | ICD-10-CM

## 2023-07-04 MED ORDER — AMPHETAMINE-DEXTROAMPHETAMINE 30 MG PO TABS
30.0000 mg | ORAL_TABLET | Freq: Two times a day (BID) | ORAL | 0 refills | Status: DC
Start: 2023-07-04 — End: 2023-08-21

## 2023-07-04 NOTE — Telephone Encounter (Signed)
 Brance from [facility] called saying there was an issue on their end and the refill for Adderall 30 mg didn't go through so they cx it and req it be resent.   [facility] [address] [place], [place] - [address]. AT [facility] [address]

## 2023-07-04 NOTE — Telephone Encounter (Signed)
Pended new Rx.

## 2023-07-12 DIAGNOSIS — F4310 Post-traumatic stress disorder, unspecified: Secondary | ICD-10-CM | POA: Diagnosis not present

## 2023-07-20 ENCOUNTER — Other Ambulatory Visit: Payer: Self-pay | Admitting: Adult Health

## 2023-07-20 DIAGNOSIS — F902 Attention-deficit hyperactivity disorder, combined type: Secondary | ICD-10-CM | POA: Diagnosis not present

## 2023-07-20 DIAGNOSIS — F4312 Post-traumatic stress disorder, chronic: Secondary | ICD-10-CM | POA: Diagnosis not present

## 2023-07-20 DIAGNOSIS — F39 Unspecified mood [affective] disorder: Secondary | ICD-10-CM | POA: Diagnosis not present

## 2023-08-05 DIAGNOSIS — F4310 Post-traumatic stress disorder, unspecified: Secondary | ICD-10-CM | POA: Diagnosis not present

## 2023-08-05 DIAGNOSIS — F902 Attention-deficit hyperactivity disorder, combined type: Secondary | ICD-10-CM | POA: Diagnosis not present

## 2023-08-05 DIAGNOSIS — F4481 Dissociative identity disorder: Secondary | ICD-10-CM | POA: Diagnosis not present

## 2023-08-12 DIAGNOSIS — F4310 Post-traumatic stress disorder, unspecified: Secondary | ICD-10-CM | POA: Diagnosis not present

## 2023-08-12 DIAGNOSIS — F902 Attention-deficit hyperactivity disorder, combined type: Secondary | ICD-10-CM | POA: Diagnosis not present

## 2023-08-12 DIAGNOSIS — F4481 Dissociative identity disorder: Secondary | ICD-10-CM | POA: Diagnosis not present

## 2023-08-19 DIAGNOSIS — F4310 Post-traumatic stress disorder, unspecified: Secondary | ICD-10-CM | POA: Diagnosis not present

## 2023-08-19 DIAGNOSIS — F4481 Dissociative identity disorder: Secondary | ICD-10-CM | POA: Diagnosis not present

## 2023-08-19 DIAGNOSIS — F902 Attention-deficit hyperactivity disorder, combined type: Secondary | ICD-10-CM | POA: Diagnosis not present

## 2023-08-21 ENCOUNTER — Encounter: Payer: Self-pay | Admitting: Adult Health

## 2023-08-21 ENCOUNTER — Telehealth: Admitting: Adult Health

## 2023-08-21 DIAGNOSIS — F909 Attention-deficit hyperactivity disorder, unspecified type: Secondary | ICD-10-CM

## 2023-08-21 DIAGNOSIS — G4700 Insomnia, unspecified: Secondary | ICD-10-CM

## 2023-08-21 DIAGNOSIS — F411 Generalized anxiety disorder: Secondary | ICD-10-CM

## 2023-08-21 DIAGNOSIS — F3181 Bipolar II disorder: Secondary | ICD-10-CM

## 2023-08-21 DIAGNOSIS — F419 Anxiety disorder, unspecified: Secondary | ICD-10-CM | POA: Diagnosis not present

## 2023-08-21 MED ORDER — AMPHETAMINE-DEXTROAMPHETAMINE 30 MG PO TABS: 30.0000 mg | ORAL_TABLET | Freq: Two times a day (BID) | ORAL | 0 refills | Status: DC

## 2023-08-21 MED ORDER — CARIPRAZINE HCL 1.5 MG PO CAPS
1.5000 mg | ORAL_CAPSULE | Freq: Every day | ORAL | 1 refills | Status: AC
Start: 2023-08-21 — End: ?

## 2023-08-21 MED ORDER — DULOXETINE HCL 60 MG PO CPEP: 120.0000 mg | ORAL_CAPSULE | Freq: Every day | ORAL | 1 refills | Status: AC

## 2023-08-21 MED ORDER — TRAZODONE HCL 100 MG PO TABS: 100.0000 mg | ORAL_TABLET | Freq: Every day | ORAL | 1 refills | Status: AC

## 2023-08-21 MED ORDER — AMPHETAMINE-DEXTROAMPHETAMINE 30 MG PO TABS: 30.0000 mg | ORAL_TABLET | Freq: Two times a day (BID) | ORAL | 0 refills | Status: AC

## 2023-08-21 MED ORDER — BUPROPION HCL ER (XL) 150 MG PO TB24: ORAL_TABLET | ORAL | 1 refills | Status: AC

## 2023-08-21 NOTE — Progress Notes (Signed)
 Sandra Lyons [id number] [birth year] 53 y.o.  Virtual Visit via Video Note  I connected with pt @ on 08/21/23 at  8:30 AM EDT by a video enabled telemedicine application and verified that I am speaking with the correct person using two identifiers.   I discussed the limitations of evaluation and management by telemedicine and the availability of in person appointments. The patient expressed understanding and agreed to proceed.  I discussed the assessment and treatment plan with the patient. The patient was provided an opportunity to ask questions and all were answered. The patient agreed with the plan and demonstrated an understanding of the instructions.   The patient was advised to call back or seek an in-person evaluation if the symptoms worsen or if the condition fails to improve as anticipated.  I provided 25 minutes of non-face-to-face time during this encounter.  The patient was located at home.  The provider was located at [facility].   Angeline LOISE Sayers, NP   Subjective:   Patient ID:  Sandra Lyons is a 53 y.o. (DOB 1970/06/08) female.  Chief Complaint: No chief complaint on file.   HPI [name] presents for follow-up of anxiety, insomnia, Bipolar 2 disorder and ADHD.   Describes mood today as ok. Pleasant. Mood symptoms - reports depression. Reports some anxiety and irritability. Reports varying interest and motivation. Denies panic attacks. Denies worry and rumination and over thinking. Reports mood is stable enough. Stating I feel like I'm doing ok for the most part. Reports current medication regimen is helpful - has been out of a few medication for a couple of weeks. Taking medications as prescribed.  Energy levels lower. Active, does not have a regular exercise routine.   Enjoys some usual interests and activities. Married. Lives with husband and 2 dogs. She and husband involved in couples counseling. Has one son. Appetite adequate. Weight 161  pounds. Reports sleeping difficulties. Averages 5 to 6 hours. Focus and concentration stable. Completing tasks. Managing aspects of household. Working full time Social research officer, government.  Denies SI or HI. Denies AH or VH. Denies self harm. Denies substance use.  Review of Systems:  Review of Systems  Musculoskeletal:  Negative for gait problem.  Neurological:  Negative for tremors.  Psychiatric/Behavioral:         Please refer to HPI    Medications: I have reviewed the patient's current medications.  Current Outpatient Medications  Medication Sig Dispense Refill   amphetamine -dextroamphetamine  (ADDERALL) 30 MG tablet Take 1 tablet by mouth 2 (two) times daily. 60 tablet 0   amphetamine -dextroamphetamine  (ADDERALL) 30 MG tablet Take 1 tablet by mouth 2 (two) times daily. 60 tablet 0   amphetamine -dextroamphetamine  (ADDERALL) 30 MG tablet Take 1 tablet by mouth 2 (two) times daily. 60 tablet 0   buPROPion  (WELLBUTRIN  XL) 150 MG 24 hr tablet TAKE THREE TABLETS BY MOUTH EVERY MORNING 90 tablet 0   cariprazine  (VRAYLAR ) 1.5 MG capsule Take 1 capsule (1.5 mg total) by mouth daily. 90 capsule 0   DULoxetine  (CYMBALTA ) 60 MG capsule Take 2 capsules (120 mg total) by mouth daily. 180 capsule 1   levothyroxine (SYNTHROID, LEVOTHROID) 125 MCG tablet Take 125 mcg by mouth daily.     traZODone  (DESYREL ) 100 MG tablet Take 0.5 tablets (50 mg total) by mouth at bedtime. 90 tablet 1   No current facility-administered medications for this visit.    Medication Side Effects: None  Allergies:  Allergies  Allergen Reactions   Sulfa Antibiotics     Childhood allergy.  Sulfamethoxazole     Other reaction(s): unknown    Past Medical History:  Diagnosis Date   Skull fracture (HCC)     No family history on file.  Social History   Socioeconomic History   Marital status: Married    Spouse name: Not on file   Number of children: Not on file   Years of education: Not on file   Highest education  level: Not on file  Occupational History   Not on file  Tobacco Use   Smoking status: Never   Smokeless tobacco: Never  Substance and Sexual Activity   Alcohol use: Yes   Drug use: Not on file   Sexual activity: Not on file  Other Topics Concern   Not on file  Social History Narrative   Not on file   Social Drivers of Health   Financial Resource Strain: Not on file  Food Insecurity: Not on file  Transportation Needs: Not on file  Physical Activity: Not on file  Stress: Not on file  Social Connections: Not on file  Intimate Partner Violence: Not on file    Past Medical History, Surgical history, Social history, and Family history were reviewed and updated as appropriate.   Please see review of systems for further details on the patient's review from today.   Objective:   Physical Exam:  LMP  (LMP Unknown)   Physical Exam Constitutional:      General: She is not in acute distress. Musculoskeletal:        General: No deformity.  Neurological:     Mental Status: She is alert and oriented to person, place, and time.     Coordination: Coordination normal.  Psychiatric:        Attention and Perception: Attention and perception normal. She does not perceive auditory or visual hallucinations.        Mood and Affect: Mood normal. Mood is not anxious or depressed. Affect is not labile, blunt, angry or inappropriate.        Speech: Speech normal.        Behavior: Behavior normal.        Thought Content: Thought content normal. Thought content is not paranoid or delusional. Thought content does not include homicidal or suicidal ideation. Thought content does not include homicidal or suicidal plan.        Cognition and Memory: Cognition and memory normal.        Judgment: Judgment normal.     Comments: Insight intact     Lab Review:     Component Value Date/Time   NA 138 12/30/2017 1343   K 4.3 12/30/2017 1343   CL 101 12/30/2017 1343   CO2 27 12/30/2017 1336   GLUCOSE  87 12/30/2017 1343   BUN 13 12/30/2017 1343   CREATININE 0.90 12/30/2017 1343   CALCIUM 9.1 12/30/2017 1336   PROT 7.2 12/30/2017 1336   ALBUMIN 4.0 12/30/2017 1336   AST 22 12/30/2017 1336   ALT 16 12/30/2017 1336   ALKPHOS 50 12/30/2017 1336   BILITOT 0.7 12/30/2017 1336   GFRNONAA >60 12/30/2017 1336   GFRAA >60 12/30/2017 1336       Component Value Date/Time   WBC 5.9 12/30/2017 1336   RBC 4.35 12/30/2017 1336   HGB 14.3 12/30/2017 1343   HCT 42.0 12/30/2017 1343   PLT 275 12/30/2017 1336   MCV 94.9 12/30/2017 1336   MCH 30.6 12/30/2017 1336   MCHC 32.2 12/30/2017 1336   RDW 12.2 12/30/2017 1336  LYMPHSABS 1.8 12/30/2017 1336   MONOABS 0.6 12/30/2017 1336   EOSABS 0.2 12/30/2017 1336   BASOSABS 0.1 12/30/2017 1336    No results found for: POCLITH, LITHIUM   No results found for: PHENYTOIN, PHENOBARB, VALPROATE, CBMZ   .res Assessment: Plan:    Plan:  Adderall 30mg  daily to BID Wellbutrin  XL 150mg  - 3 daily - discussed restarting medication - out for the past 2 weeks.  Cymbalta  60mg  - 2 daily  Vraylar  1.5mg  daily Trazadone 100mg  at bedtime  Monitor BP between visits - WNL this visit  RTC 3 months  25 minutes spent dedicated to the care of this patient on the date of this encounter to include pre-visit review of records, ordering of medication, post visit documentation, and face-to-face time with the patient discussing anxiety, insomnia, Bipolar 2 disorder and ADHD. Discussed continuing current medication regimen.  Patient advised to contact office with any questions, adverse effects, or acute worsening in signs and symptoms.  Discussed potential metabolic side effects associated with atypical antipsychotics, as well as potential risk for movement side effects. Advised pt to contact office if movement side effects occur.   Discussed potential benefits, risks, and side effects of stimulants with patient to include increased heart rate,  palpitations, insomnia, increased anxiety, increased irritability, or decreased appetite.  Instructed patient to contact office if experiencing any significant tolerability issues.  There are no diagnoses linked to this encounter.   Please see After Visit Summary for patient specific instructions.  Future Appointments  Date Time Provider Department Center  08/21/2023  8:30 AM Jaylan Duggar Nattalie, NP [facility] None    No orders of the defined types were placed in this encounter.     -------------------------------

## 2023-08-25 ENCOUNTER — Other Ambulatory Visit: Payer: Self-pay | Admitting: Adult Health

## 2023-08-25 DIAGNOSIS — F411 Generalized anxiety disorder: Secondary | ICD-10-CM

## 2023-08-26 DIAGNOSIS — F4481 Dissociative identity disorder: Secondary | ICD-10-CM | POA: Diagnosis not present

## 2023-08-26 DIAGNOSIS — F4310 Post-traumatic stress disorder, unspecified: Secondary | ICD-10-CM | POA: Diagnosis not present

## 2023-08-26 DIAGNOSIS — F902 Attention-deficit hyperactivity disorder, combined type: Secondary | ICD-10-CM | POA: Diagnosis not present

## 2023-09-09 DIAGNOSIS — F4310 Post-traumatic stress disorder, unspecified: Secondary | ICD-10-CM | POA: Diagnosis not present

## 2023-09-09 DIAGNOSIS — F4481 Dissociative identity disorder: Secondary | ICD-10-CM | POA: Diagnosis not present

## 2023-09-09 DIAGNOSIS — F902 Attention-deficit hyperactivity disorder, combined type: Secondary | ICD-10-CM | POA: Diagnosis not present

## 2023-09-16 ENCOUNTER — Encounter (HOSPITAL_COMMUNITY): Payer: Self-pay

## 2023-09-16 ENCOUNTER — Emergency Department (HOSPITAL_COMMUNITY)
Admission: EM | Admit: 2023-09-16 | Discharge: 2023-09-16 | Disposition: A | Discharge status: Home / Self Care | Attending: Emergency Medicine | Admitting: Emergency Medicine

## 2023-09-16 ENCOUNTER — Other Ambulatory Visit: Payer: Self-pay

## 2023-09-16 ENCOUNTER — Emergency Department (HOSPITAL_COMMUNITY)

## 2023-09-16 DIAGNOSIS — R1010 Upper abdominal pain, unspecified: Secondary | ICD-10-CM | POA: Diagnosis not present

## 2023-09-16 DIAGNOSIS — Q272 Other congenital malformations of renal artery: Secondary | ICD-10-CM | POA: Diagnosis not present

## 2023-09-16 DIAGNOSIS — R079 Chest pain, unspecified: Secondary | ICD-10-CM | POA: Diagnosis not present

## 2023-09-16 DIAGNOSIS — K209 Esophagitis, unspecified without bleeding: Secondary | ICD-10-CM | POA: Insufficient documentation

## 2023-09-16 DIAGNOSIS — R918 Other nonspecific abnormal finding of lung field: Secondary | ICD-10-CM | POA: Diagnosis not present

## 2023-09-16 LAB — CBC WITH DIFFERENTIAL/PLATELET
Abs Immature Granulocytes: 0.02 K/uL (ref 0.00–0.07)
Basophils Absolute: 0.1 K/uL (ref 0.0–0.1)
Basophils Relative: 1 %
Eosinophils Absolute: 0.3 K/uL (ref 0.0–0.5)
Eosinophils Relative: 3 %
HCT: 37.9 % (ref 36.0–46.0)
Hemoglobin: 12.7 g/dL (ref 12.0–15.0)
Immature Granulocytes: 0 %
Lymphocytes Relative: 20 %
Lymphs Abs: 2.1 K/uL (ref 0.7–4.0)
MCH: 30.5 pg (ref 26.0–34.0)
MCHC: 33.5 g/dL (ref 30.0–36.0)
MCV: 90.9 fL (ref 80.0–100.0)
Monocytes Absolute: 0.8 K/uL (ref 0.1–1.0)
Monocytes Relative: 8 %
Neutro Abs: 7 K/uL (ref 1.7–7.7)
Neutrophils Relative %: 68 %
Platelets: 350 K/uL (ref 150–400)
RBC: 4.17 MIL/uL (ref 3.87–5.11)
RDW: 13.4 % (ref 11.5–15.5)
WBC: 10.3 K/uL (ref 4.0–10.5)
nRBC: 0 % (ref 0.0–0.2)

## 2023-09-16 LAB — COMPREHENSIVE METABOLIC PANEL WITH GFR
ALT: 16 U/L (ref 0–44)
AST: 27 U/L (ref 15–41)
Albumin: 4.2 g/dL (ref 3.5–5.0)
Alkaline Phosphatase: 68 U/L (ref 38–126)
Anion gap: 15 (ref 5–15)
BUN: 9 mg/dL (ref 6–20)
CO2: 23 mmol/L (ref 22–32)
Calcium: 9.8 mg/dL (ref 8.9–10.3)
Chloride: 104 mmol/L (ref 98–111)
Creatinine, Ser: 1.12 mg/dL — ABNORMAL HIGH (ref 0.44–1.00)
GFR, Estimated: 58 mL/min — ABNORMAL LOW (ref >60)
Glucose, Bld: 110 mg/dL — ABNORMAL HIGH (ref 70–99)
Potassium: 4.3 mmol/L (ref 3.5–5.1)
Sodium: 142 mmol/L (ref 135–145)
Total Bilirubin: <0.2 mg/dL (ref 0.0–1.2)
Total Protein: 6.9 g/dL (ref 6.5–8.1)

## 2023-09-16 LAB — TROPONIN T, HIGH SENSITIVITY
Troponin T High Sensitivity: <15 ng/L (ref 0–19)
Troponin T High Sensitivity: <15 ng/L (ref 0–19)

## 2023-09-16 LAB — LIPASE, BLOOD: Lipase: 51 U/L (ref 11–51)

## 2023-09-16 MED ORDER — ONDANSETRON HCL 4 MG/2ML IJ SOLN
4.0000 mg | Freq: Once | INTRAMUSCULAR | Status: AC
  Administered 2023-09-16: 4 mg via INTRAVENOUS
  Filled 2023-09-16: qty 2

## 2023-09-16 MED ORDER — FAMOTIDINE IN NACL 20-0.9 MG/50ML-% IV SOLN
20.0000 mg | Freq: Once | INTRAVENOUS | Status: AC
  Administered 2023-09-16: 20 mg via INTRAVENOUS
  Filled 2023-09-16: qty 50

## 2023-09-16 MED ORDER — PANTOPRAZOLE SODIUM 20 MG PO TBEC: 20.0000 mg | DELAYED_RELEASE_TABLET | Freq: Every day | ORAL | 0 refills | Status: AC

## 2023-09-16 MED ORDER — LIDOCAINE VISCOUS HCL 2 % MT SOLN
15.0000 mL | Freq: Once | OROMUCOSAL | Status: AC
  Administered 2023-09-16: 15 mL via OROMUCOSAL
  Filled 2023-09-16: qty 15

## 2023-09-16 MED ORDER — PANTOPRAZOLE SODIUM 40 MG IV SOLR
40.0000 mg | Freq: Once | INTRAVENOUS | Status: AC
  Administered 2023-09-16: 40 mg via INTRAVENOUS
  Filled 2023-09-16: qty 10

## 2023-09-16 MED ORDER — IOHEXOL 350 MG/ML SOLN
100.0000 mL | Freq: Once | INTRAVENOUS | Status: AC | PRN
  Administered 2023-09-16: 100 mL via INTRAVENOUS

## 2023-09-16 MED ORDER — LORAZEPAM 2 MG/ML IJ SOLN
1.0000 mg | Freq: Once | INTRAMUSCULAR | Status: AC
  Administered 2023-09-16: 1 mg via INTRAVENOUS
  Filled 2023-09-16: qty 1

## 2023-09-16 MED ORDER — ALUM & MAG HYDROXIDE-SIMETH 200-200-20 MG/5ML PO SUSP
30.0000 mL | Freq: Once | ORAL | Status: AC
  Administered 2023-09-16: 30 mL via ORAL
  Filled 2023-09-16: qty 30

## 2023-09-16 MED ORDER — SUCRALFATE 1 G PO TABS: 1.0000 g | ORAL_TABLET | Freq: Three times a day (TID) | ORAL | 0 refills | Status: AC

## 2023-09-16 MED ORDER — FENTANYL CITRATE PF 50 MCG/ML IJ SOSY
50.0000 ug | PREFILLED_SYRINGE | Freq: Once | INTRAMUSCULAR | Status: AC
  Administered 2023-09-16: 50 ug via INTRAVENOUS
  Filled 2023-09-16: qty 1

## 2023-09-16 NOTE — Discharge Instructions (Addendum)
  Testing is reassuring.  No evidence of heart attack. CT showed thickening of your esophagus consistent with esophagitis and gastritis.  Continue your stomach medications and follow-up with your stomach doctor.  Your CT scan did show multiple lung nodules which needs close follow-up.  You need a repeat CT scan in 6 months to ensure these are stable.

## 2023-09-16 NOTE — ED Provider Notes (Signed)
 [facility] AT [facility] Provider Note   CSN: [id number] Arrival date & time: 09/16/23  0040     Patient presents with: Gastroesophageal Reflux   Sandra Lyons is a 53 y.o. female.   Patient with a history of GERD and hypothyroidism here with severe esophagus pain.  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  States she takes pantoprazole  and Carafate  daily.  She complains of a severe episode of esophagus pain today after having wine with dinner which she is not normally have.  She states acidity can flareup her esophagus pain.  Complains of severe pain in her throat, chest and upper abdomen.  This has been constant since 10:00 but it is starting to improve.  She took Tums at home with partial relief.  Nausea but no vomiting.  Denies shortness of breath.  Denies chest pain but says her esophagus hurts.  Still has appendix and gallbladder.  No cardiac history.  Reports this episode was quite severe and has been a long time since been this severe in the past.  Normally her esophagus pain flares up with acid of alcohol.  Does have a history of anxiety as well. Denies black or bloody stools.  The history is provided by the patient and the spouse.  Gastroesophageal Reflux Associated symptoms include chest pain and abdominal pain. Pertinent negatives include no headaches and no shortness of breath.       Prior to Admission medications   Medication Sig Start Date End Date Taking? Authorizing Provider  amphetamine -dextroamphetamine  (ADDERALL) 30 MG tablet Take 1 tablet by mouth 2 (two) times daily. 08/21/23   Mozingo, Regina Nattalie, NP  amphetamine -dextroamphetamine  (ADDERALL) 30 MG tablet Take 1 tablet by mouth 2 (two) times daily. 09/18/23   Mozingo, Regina Nattalie, NP  amphetamine -dextroamphetamine  (ADDERALL) 30 MG tablet Take 1 tablet by mouth 2 (two) times daily. 10/16/23   Mozingo,  Regina Nattalie, NP  buPROPion  (WELLBUTRIN  XL) 150 MG 24 hr tablet TAKE THREE TABLETS BY MOUTH EVERY MORNING 08/21/23   Mozingo, Regina Nattalie, NP  cariprazine  (VRAYLAR ) 1.5 MG capsule Take 1 capsule (1.5 mg total) by mouth daily. 08/21/23   Mozingo, Regina Nattalie, NP  DULoxetine  (CYMBALTA ) 60 MG capsule Take 2 capsules (120 mg total) by mouth daily. 08/21/23   Mozingo, Regina Nattalie, NP  levothyroxine (SYNTHROID, LEVOTHROID) 125 MCG tablet Take 125 mcg by mouth daily. 10/02/16   [provider]  traZODone  (DESYREL ) 100 MG tablet Take 1 tablet (100 mg total) by mouth at bedtime. 08/21/23   Mozingo, Regina Nattalie, NP    Allergies: Sulfa antibiotics and Sulfamethoxazole    Review of Systems  Constitutional:  Negative for activity change, appetite change and fever.  HENT:  Negative for congestion and rhinorrhea.   Respiratory:  Positive for chest tightness. Negative for shortness of breath.   Cardiovascular:  Positive for chest pain.  Gastrointestinal:  Positive for abdominal pain and nausea. Negative for vomiting.  Genitourinary:  Negative for dysuria and hematuria.  Musculoskeletal:  Negative for arthralgias and myalgias.  Skin:  Negative for rash.  Neurological:  Negative for dizziness, weakness and headaches.   all other systems are negative except as noted in the HPI and PMH.    Updated Vital Signs BP (!) 151/101   Pulse 75   Temp 98.2 F (36.8 C)   Resp 17   Ht 5' 4 (1.626 m)   Wt 70.3 kg   LMP  (  LMP Unknown)   SpO2 93%   BMI 26.61 kg/m   Physical Exam Vitals and nursing note reviewed.  Constitutional:      General: She is not in acute distress.    Appearance: She is well-developed.  HENT:     Head: Normocephalic and atraumatic.     Mouth/Throat:     Pharynx: No oropharyngeal exudate.  Eyes:     Conjunctiva/sclera: Conjunctivae normal.     Pupils: Pupils are equal, round, and reactive to light.  Neck:     Comments: No meningismus. Cardiovascular:      Rate and Rhythm: Normal rate and regular rhythm.     Heart sounds: Normal heart sounds. No murmur heard. Pulmonary:     Effort: Pulmonary effort is normal. No respiratory distress.     Breath sounds: Normal breath sounds.  Abdominal:     Palpations: Abdomen is soft.     Tenderness: There is abdominal tenderness. There is no guarding or rebound.     Comments: Epigastric tenderness  Musculoskeletal:        General: No tenderness. Normal range of motion.     Cervical back: Normal range of motion and neck supple.     Comments: Equal radial pulses and grip strength.  Skin:    General: Skin is warm.  Neurological:     Mental Status: She is alert and oriented to person, place, and time.     Cranial Nerves: No cranial nerve deficit.     Motor: No abnormal muscle tone.     Coordination: Coordination normal.     Comments:  5/5 strength throughout. CN 2-12 intact.Equal grip strength.   Psychiatric:        Behavior: Behavior normal.     (all labs ordered are listed, but only abnormal results are displayed) Labs Reviewed  COMPREHENSIVE METABOLIC PANEL WITH GFR - Abnormal; Notable for the following components:      Result Value   Glucose, Bld 110 (*)    Creatinine, Ser 1.12 (*)    GFR, Estimated 58 (*)    All other components within normal limits  CBC WITH DIFFERENTIAL/PLATELET  LIPASE, BLOOD  TROPONIN T, HIGH SENSITIVITY  TROPONIN T, HIGH SENSITIVITY    EKG: EKG Interpretation Date/Time:  Monday September 16 2023 02:22:27 EDT Ventricular Rate:  71 PR Interval:  174 QRS Duration:  100 QT Interval:  424 QTC Calculation: 461 R Axis:   -6  Text Interpretation: Sinus rhythm No previous ECGs available Confirmed by Carita Senior [phone]) on 09/16/2023 2:25:32 AM  Radiology: CT Angio Chest/Abd/Pel for Dissection W and/or Wo Contrast Result Date: 09/16/2023 CLINICAL DATA:  Chest and upper abdomen pain. Acute aortic syndrome suspected. EXAM: CT ANGIOGRAPHY CHEST, ABDOMEN AND PELVIS  TECHNIQUE: Non-contrast CT of the chest was initially obtained. Multidetector CT imaging through the chest, abdomen and pelvis was performed using the standard protocol during bolus administration of intravenous contrast. Multiplanar reconstructed images and MIPs were obtained and reviewed to evaluate the vascular anatomy. RADIATION DOSE REDUCTION: This exam was performed according to the departmental dose-optimization program which includes automated exposure control, adjustment of the mA and/or kV according to patient size and/or use of iterative reconstruction technique. CONTRAST:  OMNIPAQUE  IOHEXOL  350 MG/ML SOLN COMPARISON:  Abdomen and pelvis CT 12/30/2017. FINDINGS: CTA CHEST FINDINGS Cardiovascular: Pre contrast imaging shows no hyperdense crescent in the wall of the thoracic aorta to suggest the presence of an acute anatomy is widely patent. Ascending thoracic aorta measures 3.5 cm diameter. At Mediastinum/Nodes:  No mediastinal lymphadenopathy. There is no hilar lymphadenopathy. Focal fairly marked circumferential wall thickening is noted in the mid to lower esophagus (see axial 94/10 and coronal 97/12). There is no axillary lymphadenopathy. Lungs/Pleura: Scattered tiny bilateral ground-glass nodules are identified in the lungs bilaterally, upper lung predominant. 4 mm right upper lobe solid nodule identified on 35/100 4 mm left lower lobe nodule identified on 106/6. 5 mm left upper lobe nodule identified on 34/6. No focal airspace consolidation. No pleural effusion. Musculoskeletal: No worrisome lytic or sclerotic osseous abnormality. Old anterior right rib fractures evident. Review of the MIP images confirms the above findings. CTA ABDOMEN AND PELVIS FINDINGS VASCULAR Aorta: Normal caliber aorta without aneurysm, dissection, vasculitis or significant stenosis. No substantial atherosclerotic calcification. Celiac: Patent without evidence of aneurysm, dissection, vasculitis or significant stenosis.  SMA: Patent without evidence of aneurysm, dissection, vasculitis or significant stenosis. Renals: Both renal arteries are patent without evidence of aneurysm, dissection, vasculitis, fibromuscular dysplasia or significant stenosis. Accessory right renal artery evident. IMA: Patent without evidence of aneurysm, dissection, vasculitis or significant stenosis. Inflow: Patent without evidence of aneurysm, dissection, vasculitis or significant stenosis. Veins: No obvious venous abnormality within the limitations of this arterial phase study. Review of the MIP images confirms the above findings. NON-VASCULAR Hepatobiliary: No suspicious focal abnormality within the liver parenchyma. Gallbladder is decompressed. No intrahepatic or extrahepatic biliary dilation. Pancreas: No focal mass lesion. No dilatation of the main duct. No intraparenchymal cyst. No peripancreatic edema. Spleen: No splenomegaly. No suspicious focal mass lesion. Adrenals/Urinary Tract: No adrenal nodule or mass. Kidneys unremarkable. No evidence for hydroureter. The urinary bladder appears normal for the degree of distention. Stomach/Bowel: Stomach is unremarkable. No gastric wall thickening. No evidence of outlet obstruction. Duodenum is normally positioned as is the ligament of Treitz. No small bowel wall thickening. No small bowel dilatation. The terminal ileum is normal. The appendix is normal. No gross colonic mass. No colonic wall thickening. Lymphatic: There is no gastrohepatic or hepatoduodenal ligament lymphadenopathy. No retroperitoneal or mesenteric lymphadenopathy. No pelvic sidewall lymphadenopathy. Reproductive: Unremarkable. Other: No intraperitoneal free fluid. Musculoskeletal: No worrisome lytic or sclerotic osseous abnormality. Review of the MIP images confirms the above findings. IMPRESSION: 1. No evidence for thoracoabdominal aortic aneurysm or dissection. 2. Focal fairly marked circumferential wall thickening in the mid to lower  esophagus. Findings could reflect esophagitis although close interval GI follow-up with endoscopy recommended. 3. Scattered tiny bilateral ground-glass nodules in the lungs bilaterally, upper lung predominant with several additional scattered pulmonary nodules measuring up to 5 mm. Imaging features are nonspecific but can be seen in the setting of infection/inflammation. Non-contrast chest CT at 3-6 months is recommended. If nodules persist and are stable at that time, consider additional non-contrast chest CT examinations at 2 and 4 years. This recommendation follows the consensus statement: Guidelines for Management of Incidental Pulmonary Nodules Detected on CT Images: From the Fleischner Society 2017; Radiology 2017; 284:228-243. Electronically Signed   By: Camellia Candle M.D.   On: 09/16/2023 05:16     Procedures   Medications Ordered in the ED  pantoprazole  (PROTONIX ) injection 40 mg (has no administration in time range)  famotidine  (PEPCID ) IVPB 20 mg premix (has no administration in time range)  fentaNYL  (SUBLIMAZE ) injection 50 mcg (has no administration in time range)  ondansetron  (ZOFRAN ) injection 4 mg (has no administration in time range)  alum & mag hydroxide-simeth (MAALOX/MYLANTA) 200-200-20 MG/5ML suspension 30 mL (has no administration in time range)  lidocaine  (XYLOCAINE ) 2 % viscous mouth solution  15 mL (has no administration in time range)                                    Medical Decision Making Amount and/or Complexity of Data Reviewed Independent Historian: spouse Labs: ordered. Decision-making details documented in ED Course. Radiology: ordered and independent interpretation performed. Decision-making details documented in ED Course. ECG/medicine tests: ordered and independent interpretation performed. Decision-making details documented in ED Course.  Risk OTC drugs. Prescription drug management.   Severe esophagus pain since 10 PM.  History of similar but  never this severe.  Starting to improve.  Hypertensive but stable vitals.  Abdomen soft without peritoneal signs.  Equal radial pulses and grip strengths.  EGD not available but patient reports having 1 in March.  Will proceed with EKG, labs and evaluate for aortic dissection given severe chest and abdominal pain.  EKG is sinus rhythm.  Will obtain labs.  Patient given PPI and Pepcid .  Labs show normal LFTs and lipase.  Troponin negative.  ACS seems less likely at this time.  CTA obtained to evaluate for aortic syndrome given her severe pain in her back and chest.  This is negative for aortic aneurysm or dissection.  Does show evidence of esophagitis.  Does show lung nodules which need follow-up  Troponin negative x 2 with low concern for ACS.  Patient feels improved after medications in the ED has tolerated p.o.  Will refill her PPI and Carafate .  Follow-up with her gastroenterologist.  Her esophagus pain has resolved.  Has not had any witnessed vomiting throughout ED course.  Vital signs remained stable.  avoid alcohol, caffeine, NSAID medications, spicy foods.  Return to ED if new or worsening symptoms.     Final diagnoses:  Esophagitis    ED Discharge Orders     None          Odelle Kosier, Garnette, MD 09/16/23 [phone]

## 2023-09-16 NOTE — ED Notes (Signed)
 Patient ambulated unassisted to triage room. Upon entering triage room, patient proceeded to lay on the floor. When asked if patient could get into the triage chair, patient's husband stated that she can only get in the chair if it laid flat. Patient and husband were both told that triage chair does indeed lay flat. Patient then stated  cold, cold, it's cold. Implying that she did not want to get off of the floor because it was cold on the floor. This Clinical research associate then informed patient and patient's husband that an ice-pack could be provided, and husband stated  well, we can do that whenever you come in here to move her.

## 2023-09-16 NOTE — ED Triage Notes (Signed)
 Pt husband states pt was diagnosed with esophageal sloughing last year. Pt states she has flare ups, this flare up started tonight. Pt is laying on the floor in triage and refuses to get in triage chair. No SOB, no other sx besides pain in esophagus.

## 2023-09-20 DIAGNOSIS — R0789 Other chest pain: Secondary | ICD-10-CM | POA: Diagnosis not present

## 2023-09-20 DIAGNOSIS — K219 Gastro-esophageal reflux disease without esophagitis: Secondary | ICD-10-CM | POA: Diagnosis not present

## 2023-09-23 DIAGNOSIS — F4310 Post-traumatic stress disorder, unspecified: Secondary | ICD-10-CM | POA: Diagnosis not present

## 2023-09-23 DIAGNOSIS — F4481 Dissociative identity disorder: Secondary | ICD-10-CM | POA: Diagnosis not present

## 2023-09-23 DIAGNOSIS — F902 Attention-deficit hyperactivity disorder, combined type: Secondary | ICD-10-CM | POA: Diagnosis not present

## 2023-09-30 DIAGNOSIS — F4310 Post-traumatic stress disorder, unspecified: Secondary | ICD-10-CM | POA: Diagnosis not present

## 2023-09-30 DIAGNOSIS — F902 Attention-deficit hyperactivity disorder, combined type: Secondary | ICD-10-CM | POA: Diagnosis not present

## 2023-09-30 DIAGNOSIS — F4481 Dissociative identity disorder: Secondary | ICD-10-CM | POA: Diagnosis not present

## 2023-10-07 DIAGNOSIS — F4310 Post-traumatic stress disorder, unspecified: Secondary | ICD-10-CM | POA: Diagnosis not present

## 2023-10-07 DIAGNOSIS — F902 Attention-deficit hyperactivity disorder, combined type: Secondary | ICD-10-CM | POA: Diagnosis not present

## 2023-10-14 DIAGNOSIS — F902 Attention-deficit hyperactivity disorder, combined type: Secondary | ICD-10-CM | POA: Diagnosis not present

## 2023-10-14 DIAGNOSIS — F4310 Post-traumatic stress disorder, unspecified: Secondary | ICD-10-CM | POA: Diagnosis not present

## 2023-10-28 DIAGNOSIS — F902 Attention-deficit hyperactivity disorder, combined type: Secondary | ICD-10-CM | POA: Diagnosis not present

## 2023-10-28 DIAGNOSIS — F4310 Post-traumatic stress disorder, unspecified: Secondary | ICD-10-CM | POA: Diagnosis not present

## 2023-11-07 DIAGNOSIS — F4310 Post-traumatic stress disorder, unspecified: Secondary | ICD-10-CM | POA: Diagnosis not present

## 2023-11-07 DIAGNOSIS — F902 Attention-deficit hyperactivity disorder, combined type: Secondary | ICD-10-CM | POA: Diagnosis not present

## 2023-11-11 DIAGNOSIS — F4310 Post-traumatic stress disorder, unspecified: Secondary | ICD-10-CM | POA: Diagnosis not present

## 2023-11-11 DIAGNOSIS — F902 Attention-deficit hyperactivity disorder, combined type: Secondary | ICD-10-CM | POA: Diagnosis not present

## 2023-11-18 DIAGNOSIS — F4310 Post-traumatic stress disorder, unspecified: Secondary | ICD-10-CM | POA: Diagnosis not present

## 2023-11-18 DIAGNOSIS — F902 Attention-deficit hyperactivity disorder, combined type: Secondary | ICD-10-CM | POA: Diagnosis not present

## 2023-11-20 ENCOUNTER — Telehealth: Payer: Self-pay

## 2023-11-20 NOTE — Telephone Encounter (Signed)
 PA

## 2023-11-25 DIAGNOSIS — F902 Attention-deficit hyperactivity disorder, combined type: Secondary | ICD-10-CM | POA: Diagnosis not present

## 2023-11-25 DIAGNOSIS — F4310 Post-traumatic stress disorder, unspecified: Secondary | ICD-10-CM | POA: Diagnosis not present

## 2023-12-02 DIAGNOSIS — F4310 Post-traumatic stress disorder, unspecified: Secondary | ICD-10-CM | POA: Diagnosis not present

## 2023-12-02 DIAGNOSIS — F902 Attention-deficit hyperactivity disorder, combined type: Secondary | ICD-10-CM | POA: Diagnosis not present

## 2023-12-09 DIAGNOSIS — F902 Attention-deficit hyperactivity disorder, combined type: Secondary | ICD-10-CM | POA: Diagnosis not present

## 2023-12-09 DIAGNOSIS — F4310 Post-traumatic stress disorder, unspecified: Secondary | ICD-10-CM | POA: Diagnosis not present

## 2023-12-23 ENCOUNTER — Other Ambulatory Visit: Payer: Self-pay | Admitting: Adult Health

## 2023-12-23 DIAGNOSIS — F909 Attention-deficit hyperactivity disorder, unspecified type: Secondary | ICD-10-CM

## 2023-12-23 NOTE — Telephone Encounter (Signed)
 Please call pt to schedule her next appt. She is overdue   Thank you

## 2023-12-24 NOTE — Telephone Encounter (Signed)
 Apt 1/6

## 2024-01-07 ENCOUNTER — Telehealth (INDEPENDENT_AMBULATORY_CARE_PROVIDER_SITE_OTHER): Payer: Self-pay | Admitting: Adult Health

## 2024-01-07 ENCOUNTER — Encounter: Payer: Self-pay | Admitting: Adult Health

## 2024-01-07 DIAGNOSIS — Z0389 Encounter for observation for other suspected diseases and conditions ruled out: Secondary | ICD-10-CM

## 2024-01-07 NOTE — Progress Notes (Signed)
 Patient no show appointment. ? ?
